# Patient Record
Sex: Male | Born: 1995 | Race: Black or African American | Hispanic: No | Marital: Single | Smoking: Current some day smoker
Health system: Southern US, Community
[De-identification: ages and names within clinical notes are randomized; demographics above are authoritative.]

## PROBLEM LIST (undated history)

## (undated) HISTORY — PX: NECK SURGERY: SHX720

---

## 2017-08-22 ENCOUNTER — Emergency Department (HOSPITAL_BASED_OUTPATIENT_CLINIC_OR_DEPARTMENT_OTHER)
Admission: EM | Admit: 2017-08-22 | Discharge: 2017-08-22 | Disposition: A | Discharge status: Home / Self Care | Payer: Self-pay | Attending: Emergency Medicine | Admitting: Emergency Medicine

## 2017-08-22 ENCOUNTER — Emergency Department (HOSPITAL_BASED_OUTPATIENT_CLINIC_OR_DEPARTMENT_OTHER): Payer: Self-pay

## 2017-08-22 ENCOUNTER — Encounter (HOSPITAL_BASED_OUTPATIENT_CLINIC_OR_DEPARTMENT_OTHER): Payer: Self-pay | Admitting: Emergency Medicine

## 2017-08-22 ENCOUNTER — Other Ambulatory Visit: Payer: Self-pay

## 2017-08-22 DIAGNOSIS — Y92410 Unspecified street and highway as the place of occurrence of the external cause: Secondary | ICD-10-CM | POA: Insufficient documentation

## 2017-08-22 DIAGNOSIS — F17200 Nicotine dependence, unspecified, uncomplicated: Secondary | ICD-10-CM | POA: Insufficient documentation

## 2017-08-22 DIAGNOSIS — Y9389 Activity, other specified: Secondary | ICD-10-CM | POA: Insufficient documentation

## 2017-08-22 DIAGNOSIS — Y999 Unspecified external cause status: Secondary | ICD-10-CM | POA: Insufficient documentation

## 2017-08-22 DIAGNOSIS — R0789 Other chest pain: Secondary | ICD-10-CM

## 2017-08-22 DIAGNOSIS — S060X0A Concussion without loss of consciousness, initial encounter: Secondary | ICD-10-CM | POA: Insufficient documentation

## 2017-08-22 MED ORDER — IBUPROFEN 400 MG PO TABS
600.0000 mg | ORAL_TABLET | Freq: Once | ORAL | Status: AC
  Administered 2017-08-22: 600 mg via ORAL
  Filled 2017-08-22: qty 1
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
## 2017-08-22 NOTE — Discharge Instructions (Addendum)
If your headache worsens or you develop vomiting, blurry vision, dizziness, or weakness/numbness in your arms or legs, or confusion then return to the ER for evaluation.  Otherwise take ibuprofen and/or Tylenol for pain.

## 2017-08-23 ENCOUNTER — Emergency Department (HOSPITAL_BASED_OUTPATIENT_CLINIC_OR_DEPARTMENT_OTHER)
Admission: EM | Admit: 2017-08-23 | Discharge: 2017-08-24 | Disposition: A | Discharge status: Home / Self Care | Payer: Self-pay | Attending: Emergency Medicine | Admitting: Emergency Medicine

## 2017-08-23 ENCOUNTER — Encounter (HOSPITAL_BASED_OUTPATIENT_CLINIC_OR_DEPARTMENT_OTHER): Payer: Self-pay

## 2017-08-23 ENCOUNTER — Other Ambulatory Visit: Payer: Self-pay

## 2017-08-23 DIAGNOSIS — G44319 Acute post-traumatic headache, not intractable: Secondary | ICD-10-CM | POA: Insufficient documentation

## 2017-08-23 DIAGNOSIS — F17200 Nicotine dependence, unspecified, uncomplicated: Secondary | ICD-10-CM | POA: Insufficient documentation

## 2017-08-23 NOTE — ED Triage Notes (Signed)
Pt c/o cont'd pain to right side of head and back of head-was seen here yesterday dx with concussion-NAD-steady gait

## 2017-08-24 MED ORDER — PROCHLORPERAZINE EDISYLATE 10 MG/2ML IJ SOLN
10.0000 mg | Freq: Once | INTRAMUSCULAR | Status: AC
  Administered 2017-08-24: 10 mg via INTRAMUSCULAR
  Filled 2017-08-24: qty 2

## 2017-08-24 MED ORDER — IBUPROFEN 600 MG PO TABS: 600.0000 mg | ORAL_TABLET | Freq: Three times a day (TID) | ORAL | 0 refills | Status: DC | PRN

## 2017-08-24 MED ORDER — KETOROLAC TROMETHAMINE 60 MG/2ML IM SOLN
60.0000 mg | Freq: Once | INTRAMUSCULAR | Status: AC
  Administered 2017-08-24: 60 mg via INTRAMUSCULAR
  Filled 2017-08-24: qty 2

## 2017-08-24 NOTE — ED Notes (Signed)
ED Provider at bedside. 
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
## 2017-11-17 ENCOUNTER — Other Ambulatory Visit: Payer: Self-pay

## 2017-11-17 ENCOUNTER — Encounter (HOSPITAL_BASED_OUTPATIENT_CLINIC_OR_DEPARTMENT_OTHER): Payer: Self-pay

## 2017-11-17 DIAGNOSIS — K029 Dental caries, unspecified: Secondary | ICD-10-CM | POA: Insufficient documentation

## 2017-11-17 DIAGNOSIS — K0381 Cracked tooth: Secondary | ICD-10-CM | POA: Insufficient documentation

## 2017-11-17 DIAGNOSIS — F17200 Nicotine dependence, unspecified, uncomplicated: Secondary | ICD-10-CM | POA: Insufficient documentation

## 2017-11-17 NOTE — ED Triage Notes (Signed)
C/o left upper toothache and HA x 2 hours-NAD-steady gait

## 2017-11-18 ENCOUNTER — Emergency Department (HOSPITAL_BASED_OUTPATIENT_CLINIC_OR_DEPARTMENT_OTHER)
Admission: EM | Admit: 2017-11-18 | Discharge: 2017-11-18 | Disposition: A | Discharge status: Home / Self Care | Payer: Self-pay | Attending: Emergency Medicine | Admitting: Emergency Medicine

## 2017-11-18 DIAGNOSIS — K029 Dental caries, unspecified: Secondary | ICD-10-CM

## 2017-11-18 MED ORDER — PENICILLIN V POTASSIUM 500 MG PO TABS: 500.0000 mg | ORAL_TABLET | Freq: Four times a day (QID) | ORAL | 0 refills | Status: AC

## 2017-11-18 MED ORDER — PENICILLIN V POTASSIUM 250 MG PO TABS
500.0000 mg | ORAL_TABLET | Freq: Once | ORAL | Status: AC
  Administered 2017-11-18: 500 mg via ORAL
  Filled 2017-11-18: qty 2

## 2017-11-18 MED ORDER — HYDROCODONE-ACETAMINOPHEN 5-325 MG PO TABS: 1.0000 | ORAL_TABLET | ORAL | 0 refills | Status: DC | PRN

## 2017-11-18 MED ORDER — HYDROCODONE-ACETAMINOPHEN 5-325 MG PO TABS
1.0000 | ORAL_TABLET | Freq: Once | ORAL | Status: AC
  Administered 2017-11-18: 1 via ORAL
  Filled 2017-11-18: qty 1

## 2017-11-18 NOTE — ED Notes (Signed)
Pt brought back to room, had to be woken up in the waiting room. Pt holding a towel to his mouth, c/o dental pain

## 2017-11-18 NOTE — ED Notes (Signed)
Pt verbalizes understanding of d/c instructions and denies any further needs at this time. 

## 2017-11-18 NOTE — ED Provider Notes (Signed)
   [facility] Provider Note: Marco DellJ. Lane Brittish Bolinger, MD, FACEP  CSN: [id number] MRN: [id number] ARRIVAL: 11/17/17 at 2215 ROOM: [facility]   CHIEF COMPLAINT  Dental Pain   HISTORY OF PRESENT ILLNESS  11/18/17 1:02 AM Marco Atkinson is a 22 y.o. male who complains of 4 hours of severe pain associated with his left upper third molar.  That molar has been cracked for some time but just became painful yesterday evening.  The pain radiates to the entire left side of his face.  Pain is worse with eating or drinking.  He does not have a dentist locally.   History reviewed. No pertinent past medical history.  Past Surgical History:  Procedure Laterality Date  . NECK SURGERY      No family history on file.  Social History   Tobacco Use  . Smoking status: Current Every Day Smoker  . Smokeless tobacco: Never Used  Substance Use Topics  . Alcohol use: Yes    Comment: occ  . Drug use: Never    Prior to Admission medications   Medication Sig Start Date End Date Taking? Authorizing Provider  ibuprofen (ADVIL,MOTRIN) 600 MG tablet Take 1 tablet (600 mg total) by mouth every 8 (eight) hours as needed. 08/24/17   Marco Atkinson, Kevin, MD    Allergies Patient has no known allergies.   REVIEW OF SYSTEMS  Negative except as noted here or in the History of Present Illness.   PHYSICAL EXAMINATION  Initial Vital Signs Blood pressure (!) 141/94, pulse 80, temperature 98.9 F (37.2 C), temperature source Oral, resp. rate 18, height 5\' 6"  (1.676 m), weight 58.5 kg, SpO2 94 %.  Examination General: Well-developed, well-nourished male in no acute distress; appearance consistent with age of record HENT: normocephalic; atraumatic; fractured, carious left upper third molar with adjacent gum swelling and tenderness Eyes: pupils equal, round and reactive to light; extraocular muscles intact Neck: supple; no lymphadenopathy Heart: regular rate and rhythm Lungs: clear to auscultation  bilaterally Abdomen: soft; nondistended; nontender; bowel sounds present Extremities: No deformity; full range of motion Neurologic: Awake, alert and oriented; motor function intact in all extremities and symmetric; no facial droop Skin: Warm and dry Psychiatric: Flat affect   RESULTS  Summary of this visit's results, reviewed by myself:   EKG Interpretation  Date/Time:    Ventricular Rate:    PR Interval:    QRS Duration:   QT Interval:    QTC Calculation:   R Axis:     Text Interpretation:        Laboratory Studies: No results found for this or any previous visit (from the past 24 hour(s)). Imaging Studies: No results found.  ED COURSE and MDM  Nursing notes and initial vitals signs, including pulse oximetry, reviewed.  Vitals:   11/17/17 2227 11/17/17 2229  BP:  (!) 141/94  Pulse:  80  Resp:  18  Temp:  98.9 F (37.2 C)  TempSrc:  Oral  SpO2:  94%  Weight: 58.5 kg   Height: 5\' 6"  (1.676 m)    We do not have a dentist or oral surgeon on-call.  The patient was advised that he needs to find a dentist for definitive treatment.  PROCEDURES    ED DIAGNOSES     ICD-10-CM   1. Pain due to dental caries K02.9        Ansh Fauble, Jonny RuizJohn, MD 11/18/17 [phone]

## 2018-10-19 ENCOUNTER — Encounter (HOSPITAL_BASED_OUTPATIENT_CLINIC_OR_DEPARTMENT_OTHER): Payer: Self-pay | Admitting: Adult Health

## 2018-10-19 ENCOUNTER — Other Ambulatory Visit: Payer: Self-pay

## 2018-10-19 ENCOUNTER — Emergency Department (HOSPITAL_BASED_OUTPATIENT_CLINIC_OR_DEPARTMENT_OTHER)
Admission: EM | Admit: 2018-10-19 | Discharge: 2018-10-20 | Disposition: A | Discharge status: Home / Self Care | Payer: Self-pay | Attending: Emergency Medicine | Admitting: Emergency Medicine

## 2018-10-19 DIAGNOSIS — F17210 Nicotine dependence, cigarettes, uncomplicated: Secondary | ICD-10-CM | POA: Insufficient documentation

## 2018-10-19 DIAGNOSIS — J029 Acute pharyngitis, unspecified: Secondary | ICD-10-CM | POA: Insufficient documentation

## 2018-10-19 NOTE — ED Triage Notes (Signed)
Presents with sore throat that began this am after working. He denies fevers.

## 2018-10-20 ENCOUNTER — Encounter (HOSPITAL_BASED_OUTPATIENT_CLINIC_OR_DEPARTMENT_OTHER): Payer: Self-pay | Admitting: Emergency Medicine

## 2018-10-20 LAB — GROUP A STREP BY PCR: Group A Strep by PCR: NOT DETECTED

## 2018-10-20 MED ORDER — ALUM & MAG HYDROXIDE-SIMETH 200-200-20 MG/5ML PO SUSP
ORAL | Status: AC
  Administered 2018-10-20: 30 mL
  Filled 2018-10-20: qty 30

## 2018-10-20 MED ORDER — LIDOCAINE VISCOUS HCL 2 % MT SOLN
OROMUCOSAL | Status: AC
  Administered 2018-10-20: 15 mL
  Filled 2018-10-20: qty 15

## 2018-10-20 MED ORDER — ACETAMINOPHEN 500 MG PO TABS
ORAL_TABLET | ORAL | Status: AC
  Administered 2018-10-20: 1000 mg
  Filled 2018-10-20: qty 2

## 2018-10-20 NOTE — ED Provider Notes (Signed)
[facility] EMERGENCY DEPARTMENT Provider Note   CSN: [id number] Arrival date & time: 10/19/18  2329     History   Chief Complaint Chief Complaint  Patient presents with  . Sore Throat    HPI Marco Atkinson is a 23 y.o. male.     The history is provided by the patient.  Sore Throat This is a new problem. The current episode started 6 to 12 hours ago. The problem occurs constantly. The problem has not changed since onset.Pertinent negatives include no chest pain, no abdominal pain, no headaches and no shortness of breath. Nothing aggravates the symptoms. Nothing relieves the symptoms. He has tried nothing for the symptoms. The treatment provided no relief.  Sore throat started at work.  No F/C/R.  No cough.  No sob.  No anosmia.  No covid exposure.  Intact voice no pain swallowing.    History reviewed. No pertinent past medical history.  There are no active problems to display for this patient.   Past Surgical History:  Procedure Laterality Date  . NECK SURGERY          Home Medications    Prior to Admission medications   Medication Sig Start Date End Date Taking? Authorizing Provider  HYDROcodone-acetaminophen (NORCO) 5-325 MG tablet Take 1 tablet by mouth every 4 (four) hours as needed for severe pain. 11/18/17   Molpus, John, MD  ibuprofen (ADVIL,MOTRIN) 600 MG tablet Take 1 tablet (600 mg total) by mouth every 8 (eight) hours as needed. 08/24/17   Azalia Bilisampos, Kevin, MD    Family History History reviewed. No pertinent family history.  Social History Social History   Tobacco Use  . Smoking status: Current Every Day Smoker  . Smokeless tobacco: Never Used  Substance Use Topics  . Alcohol use: Yes    Comment: occ  . Drug use: Never     Allergies   Patient has no known allergies.   Review of Systems Review of Systems  Constitutional: Negative for diaphoresis and fever.  HENT: Positive for sore throat. Negative for congestion, drooling and  trouble swallowing.   Eyes: Negative for visual disturbance.  Respiratory: Negative for cough and shortness of breath.   Cardiovascular: Negative for chest pain.  Gastrointestinal: Negative for abdominal pain.  Genitourinary: Negative for difficulty urinating.  Neurological: Negative for headaches.  Psychiatric/Behavioral: Negative for agitation.  All other systems reviewed and are negative.    Physical Exam Updated Vital Signs BP 124/79   Pulse 80   Temp 98.8 F (37.1 C) (Oral)   Resp 18   Ht 5\' 6"  (1.676 m)   Wt 64.4 kg   SpO2 100%   BMI 22.92 kg/m   Physical Exam Vitals signs and nursing note reviewed.  Constitutional:      Appearance: He is normal weight.  HENT:     Head: Normocephalic and atraumatic.     Nose: Nose normal.     Mouth/Throat:     Mouth: Mucous membranes are moist.     Pharynx: Oropharynx is clear. No oropharyngeal exudate.     Comments: Intact phonation no pain with displacement of the trachea Eyes:     Conjunctiva/sclera: Conjunctivae normal.     Pupils: Pupils are equal, round, and reactive to light.  Neck:     Musculoskeletal: Normal range of motion and neck supple.  Cardiovascular:     Rate and Rhythm: Normal rate and regular rhythm.     Pulses: Normal pulses.     Heart sounds: Normal  heart sounds.  Pulmonary:     Effort: Pulmonary effort is normal.     Breath sounds: Normal breath sounds.  Abdominal:     General: Abdomen is flat. Bowel sounds are normal.     Tenderness: There is no abdominal tenderness. There is no guarding.  Musculoskeletal: Normal range of motion.  Lymphadenopathy:     Cervical: No cervical adenopathy.  Skin:    General: Skin is warm and dry.     Capillary Refill: Capillary refill takes less than 2 seconds.  Neurological:     General: No focal deficit present.     Mental Status: He is alert and oriented to person, place, and time.  Psychiatric:        Mood and Affect: Mood normal.        Behavior: Behavior  normal.      ED Treatments / Results  Labs (all labs ordered are listed, but only abnormal results are displayed) Labs Reviewed  GROUP A STREP BY PCR    EKG None  Radiology No results found.  Procedures Procedures (including critical care time)  Medications Ordered in ED Medications - No data to display  Doubt COVID.  Likely allergic in nature.  Alternate tylenol and ibuprofen at home.  Strep is negative.    Final Clinical Impressions(s) / ED Diagnoses   Final diagnoses:  Sore throat   Return for intractable cough, coughing up blood,fevers >100.4 unrelieved by medication, shortness of breath, intractable vomiting, chest pain, shortness of breath, weakness,numbness, changes in speech, facial asymmetry,abdominal pain, passing out,Inability to tolerate liquids or food, cough, altered mental status or any concerns. No signs of systemic illness or infection. The patient is nontoxic-appearing on exam and vital signs are within normal limits.   I have reviewed the triage vital signs and the nursing notes. Pertinent labs &imaging results that were available during my care of the patient were reviewed by me and considered in my medical decision making (see chart for details).  After history, exam, and medical workup I feel the patient has been appropriately medically screened and is safe for discharge home. Pertinent diagnoses were discussed with the patient. Patient was given return precautions   Arrie Zuercher, MD 10/20/18 [id number]

## 2018-12-12 ENCOUNTER — Emergency Department (HOSPITAL_BASED_OUTPATIENT_CLINIC_OR_DEPARTMENT_OTHER)
Admission: EM | Admit: 2018-12-12 | Discharge: 2018-12-12 | Disposition: A | Discharge status: Home / Self Care | Payer: Self-pay | Attending: Emergency Medicine | Admitting: Emergency Medicine

## 2018-12-12 ENCOUNTER — Other Ambulatory Visit: Payer: Self-pay

## 2018-12-12 ENCOUNTER — Encounter (HOSPITAL_BASED_OUTPATIENT_CLINIC_OR_DEPARTMENT_OTHER): Payer: Self-pay

## 2018-12-12 DIAGNOSIS — F17210 Nicotine dependence, cigarettes, uncomplicated: Secondary | ICD-10-CM | POA: Insufficient documentation

## 2018-12-12 DIAGNOSIS — J302 Other seasonal allergic rhinitis: Secondary | ICD-10-CM | POA: Insufficient documentation

## 2018-12-12 DIAGNOSIS — F1210 Cannabis abuse, uncomplicated: Secondary | ICD-10-CM | POA: Insufficient documentation

## 2018-12-12 DIAGNOSIS — H1031 Unspecified acute conjunctivitis, right eye: Secondary | ICD-10-CM | POA: Insufficient documentation

## 2018-12-12 MED ORDER — ERYTHROMYCIN 5 MG/GM OP OINT: TOPICAL_OINTMENT | OPHTHALMIC | 0 refills | Status: DC

## 2018-12-12 MED ORDER — FLUTICASONE PROPIONATE 50 MCG/ACT NA SUSP: 1.0000 | Freq: Every day | NASAL | 0 refills | Status: DC

## 2018-12-12 NOTE — ED Triage Notes (Addendum)
Pt c/o redness and irritation "pink eye" to right eye x 2 days-NAD-steady gait

## 2018-12-12 NOTE — ED Provider Notes (Signed)
[facility] EMERGENCY DEPARTMENT Provider Note   CSN: [id number] Arrival date & time: 12/12/18  1842     History   Chief Complaint Chief Complaint  Patient presents with  . Conjunctivitis    HPI Marco Atkinson is a 23 y.o. male    Patient complains of 2 days of right eye redness with white discharge and crusting in the morning.  Patient states that he has had no changes in vision but some mild pain intermittently.  Patient states he also has some congestion intermittently over the past week.  Denies any sick contacts.  Is concerned since pinkeye.  Has no history of pinkeye.  Not a contact lens wearer.  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  Patient also states that he has not scratched his eye, been hit in the eye or done any recent metal grinding.       HPI  History reviewed. No pertinent past medical history.  There are no active problems to display for this patient.   Past Surgical History:  Procedure Laterality Date  . NECK SURGERY          Home Medications    Prior to Admission medications   Medication Sig Start Date End Date Taking? Authorizing Provider  erythromycin ophthalmic ointment Place a 1/2 inch ribbon of ointment into the lower eyelid every 6 hours for the next 7 days 12/12/18   Solon Augusta S, PA  fluticasone (FLONASE) 50 MCG/ACT nasal spray Place 1 spray into both nostrils daily. 12/12/18   Gailen Shelter, PA  HYDROcodone-acetaminophen (NORCO) 5-325 MG tablet Take 1 tablet by mouth every 4 (four) hours as needed for severe pain. 11/18/17   Molpus, John, MD  ibuprofen (ADVIL,MOTRIN) 600 MG tablet Take 1 tablet (600 mg total) by mouth every 8 (eight) hours as needed. 08/24/17   Azalia Bilis, MD    Family History No family history on file.  Social History Social History   Tobacco Use  . Smoking status:  Current Every Day Smoker    Types: Cigarettes  . Smokeless tobacco: Never Used  Substance Use Topics  . Alcohol use: Yes    Comment: weekly  . Drug use: Yes    Types: Marijuana     Allergies   Patient has no known allergies.   Review of Systems Review of Systems  Constitutional: Negative for chills and fever.  HENT: Positive for rhinorrhea. Negative for sore throat.   Eyes: Negative for visual disturbance.  Respiratory: Negative for cough.      Physical Exam Updated Vital Signs BP 114/67 (BP Location: Left Arm)   Pulse 68   Temp 98.5 F (36.9 C) (Oral)   Resp 18   Ht 5\' 6"  (1.676 m)   Wt 59.4 kg   SpO2 96%   BMI 21.14 kg/m   Physical Exam Vitals signs and nursing note reviewed.  Constitutional:      General: He is not in acute distress.    Appearance: Normal appearance. He is not ill-appearing.  HENT:     Head: Normocephalic and atraumatic.     Nose:     Comments: No maxillary facial sinus tenderness. Eyes:     General: No scleral icterus.       Right eye: Discharge present.        Left eye: No discharge.     Extraocular Movements:  Extraocular movements intact.     Conjunctiva/sclera: Conjunctivae normal.     Comments: Patient has generalized scleral injection with small amount of white discharge in the medial canthus.  Neck:     Musculoskeletal: No neck rigidity or muscular tenderness.     Comments: No anterior, posterior cervical, pre-or postauricular lymphadenopathy Cardiovascular:     Rate and Rhythm: Normal rate.  Pulmonary:     Effort: Pulmonary effort is normal.     Breath sounds: No stridor.  Neurological:     Mental Status: He is alert and oriented to person, place, and time. Mental status is at baseline.      ED Treatments / Results  Labs (all labs ordered are listed, but only abnormal results are displayed) Labs Reviewed - No data to display  EKG None  Radiology No results found.  Procedures Procedures (including critical care  time)  Medications Ordered in ED Medications - No data to display   Initial Impression / Assessment and Plan / ED Course  I have reviewed the triage vital signs and the nursing notes.  Pertinent labs & imaging results that were available during my care of the patient were reviewed by me and considered in my medical decision making (see chart for details).        Patient appears to have pinkeye on my exam.  Patient's history is consistent with either bacterial or viral conjunctivitis.  I will treat with erythromycin as patient has purulent discharge in the morning.  Crusting.  However I do have suspicion for seasonal allergies and will prescribe fluticasone in direct patient to use over-the-counter antihistamine eyedrop once he is done using erythromycin eye.  Fluticasone prescribed for sinus congestion.  No evidence of sinus infection.  Patient symptoms are less than 10 days.  Vital signs are all within normal notes.   Final Clinical Impressions(s) / ED Diagnoses   Final diagnoses:  Seasonal allergic rhinitis, unspecified trigger  Acute bacterial conjunctivitis of right eye    ED Discharge Orders         Ordered    erythromycin ophthalmic ointment     12/12/18 2149    fluticasone (FLONASE) 50 MCG/ACT nasal spray  Daily     12/12/18 2149           Tedd Sias, [place] 12/12/18 2153    Quintella Reichert, MD 12/14/18 1250

## 2018-12-12 NOTE — Discharge Instructions (Addendum)
Use fluticasone daily as prescribed.  May need to use over-the-counter in the same fashion once you are done with the supply.  Apply erythromycin antibiotic ointment to right eye as prescription directs.  Once you have completed course of antibiotic ointment please begin using over-the-counter antihistamine eyedrops.

## 2018-12-12 NOTE — ED Triage Notes (Signed)
Pt to desk to ask how much longer he had to wait.  Pt reassured. Pt turned and walked out the front door. No notification if he was coming back or not.

## 2019-04-09 ENCOUNTER — Other Ambulatory Visit: Payer: Self-pay

## 2019-04-09 ENCOUNTER — Encounter (HOSPITAL_COMMUNITY): Payer: Self-pay

## 2019-04-09 ENCOUNTER — Ambulatory Visit (HOSPITAL_COMMUNITY)
Admission: EM | Admit: 2019-04-09 | Discharge: 2019-04-09 | Disposition: A | Discharge status: Home / Self Care | Payer: Self-pay | Attending: Emergency Medicine | Admitting: Emergency Medicine

## 2019-04-09 DIAGNOSIS — R0789 Other chest pain: Secondary | ICD-10-CM

## 2019-04-09 MED ORDER — NAPROXEN 500 MG PO TABS: 500.0000 mg | ORAL_TABLET | Freq: Two times a day (BID) | ORAL | 0 refills | Status: DC

## 2019-04-09 NOTE — ED Triage Notes (Signed)
Pt c/o mid-sternal CP since last night while drinking ETOH, and smoking marijuana. States CP increases with inspiration and with twisting upper body/torso toward right. Denies n/v, diaphoresis, dizziness.

## 2019-04-09 NOTE — Discharge Instructions (Signed)
Your EKG was normal, this chest pain seems most likely from inflammation within the muscles of the chest wall Begin Naprosyn twice daily for the next 1 to 2 weeks, please take with food  Please monitor for symptoms to resolve over the next week with taking the anti-inflammatories  If chest pain changing, worsening, developing difficulty breathing or increased shortness of breath please follow-up for reevaluation

## 2019-04-10 NOTE — ED Provider Notes (Signed)
[facility]    CSN: [id number] Arrival date & time: 04/09/19  1721      History   Chief Complaint Chief Complaint  Patient presents with  . Chest Pain    HPI Marco Atkinson is a 24 y.o. male no significant past medical history presenting today for evaluation of chest pain.  Patient states that he began to develop some discomfort in his chest last night.  He reports this began after drinking alcohol as well as smoking marijuana.  Notes that his pain worsens with breathing as well as movement.  He does report vomiting last night, but denies current nausea or vomiting.  Denies dizziness or lightheadedness.  Denies abdominal pain.  Denies history of any heart issues.  Denies hypertension.  Denies diabetes.  Denies family history of early death from MI.  Does report tobacco use, approximately a pack per day.  Denies prior DVT/PE.  Denies leg pain or leg swelling.  Denies recent travel immobilization.  Has not taken any medicines for symptoms.  Denies associated cough or recent illness.  HPI  History reviewed. No pertinent past medical history.  There are no problems to display for this patient.   Past Surgical History:  Procedure Laterality Date  . NECK SURGERY         Home Medications    Prior to Admission medications   Medication Sig Start Date End Date Taking? Authorizing Provider  naproxen (NAPROSYN) 500 MG tablet Take 1 tablet (500 mg total) by mouth 2 (two) times daily. 04/09/19   Davidson Palmieri C, PA-C  fluticasone (FLONASE) 50 MCG/ACT nasal spray Place 1 spray into both nostrils daily. 12/12/18 04/09/19  Gailen Shelter, PA    Family History Family History  Problem Relation Age of Onset  . Healthy Mother   . Healthy Father     Social History Social History   Tobacco Use  . Smoking status: Current Every Day Smoker    Types: Cigarettes  . Smokeless tobacco: Never Used  Substance Use Topics  . Alcohol use: Yes    Comment: weekly  . Drug use: Yes    Types: Marijuana     Allergies   Patient has no known allergies.   Review of Systems Review of Systems  Constitutional: Negative for activity change, appetite change, chills, fatigue and fever.  HENT: Negative for congestion, ear pain, rhinorrhea, sinus pressure, sore throat and trouble swallowing.   Eyes: Negative for discharge and redness.  Respiratory: Negative for cough, chest tightness and shortness of breath.   Cardiovascular: Positive for chest pain. Negative for leg swelling.  Gastrointestinal: Negative for abdominal pain, diarrhea, nausea and vomiting.  Musculoskeletal: Negative for myalgias.  Skin: Negative for rash.  Neurological: Negative for dizziness, light-headedness and headaches.     Physical Exam Triage Vital Signs ED Triage Vitals  Enc Vitals Group     BP 04/09/19 1748 (!) 149/83     Pulse Rate 04/09/19 1748 90     Resp 04/09/19 1748 18     Temp 04/09/19 1748 98.2 F (36.8 C)     Temp Source 04/09/19 1748 Oral     SpO2 04/09/19 1748 96 %     Weight --      Height --      Head Circumference --      Peak Flow --      Pain Score 04/09/19 1747 6     Pain Loc --      Pain Edu? --  Excl. in GC? --    No data found.  Updated Vital Signs BP (!) 149/83 (BP Location: Right Arm)   Pulse 90   Temp 98.2 F (36.8 C) (Oral)   Resp 18   SpO2 96%   Visual Acuity Right Eye Distance:   Left Eye Distance:   Bilateral Distance:    Right Eye Near:   Left Eye Near:    Bilateral Near:     Physical Exam Vitals and nursing note reviewed.  Constitutional:      Appearance: He is well-developed.  HENT:     Head: Normocephalic and atraumatic.     Ears:     Comments: Bilateral ears without tenderness to palpation of external auricle, tragus and mastoid, EAC's without erythema or swelling, TM's with good bony landmarks and cone of light. Non erythematous.     Mouth/Throat:     Comments: Oral mucosa pink and moist, no tonsillar enlargement or exudate.  Posterior pharynx patent and nonerythematous, no uvula deviation or swelling. Normal phonation.  Eyes:     Conjunctiva/sclera: Conjunctivae normal.  Cardiovascular:     Rate and Rhythm: Normal rate and regular rhythm.     Heart sounds: No murmur.  Pulmonary:     Effort: Pulmonary effort is normal. No respiratory distress.     Breath sounds: Normal breath sounds.     Comments: Breathing comfortably at rest, CTABL, no wheezing, rales or other adventitious sounds auscultated  Anterior chest diffusely tender especially on right and central areas Chest:     Chest wall: Tenderness present.  Abdominal:     Palpations: Abdomen is soft.     Tenderness: There is no abdominal tenderness.  Musculoskeletal:     Cervical back: Neck supple.  Skin:    General: Skin is warm and dry.  Neurological:     Mental Status: He is alert.      UC Treatments / Results  Labs (all labs ordered are listed, but only abnormal results are displayed) Labs Reviewed - No data to display  EKG   Radiology No results found.  Procedures Procedures (including critical care time)  Medications Ordered in UC Medications - No data to display  Initial Impression / Assessment and Plan / UC Course  I have reviewed the triage vital signs and the nursing notes.  Pertinent labs & imaging results that were available during my care of the patient were reviewed by me and considered in my medical decision making (see chart for details).     EKG normal sinus rhythm with sinus arrhythmia.  No acute signs of ischemia or infarction.  Patient is low risk for cardiac etiology.  PERC negative.  Given reproducible on exam, most likely MSK cause, inflammation within muscles, possibly related to either smoking or vomiting last night.  Providing Naprosyn to take twice daily and continue to monitor discomfort over the next few days.  Discussed strict return precautions. Patient verbalized understanding and is agreeable with  plan.  Final Clinical Impressions(s) / UC Diagnoses   Final diagnoses:  Chest wall pain     Discharge Instructions     Your EKG was normal, this chest pain seems most likely from inflammation within the muscles of the chest wall Begin Naprosyn twice daily for the next 1 to 2 weeks, please take with food  Please monitor for symptoms to resolve over the next week with taking the anti-inflammatories  If chest pain changing, worsening, developing difficulty breathing or increased shortness of breath please follow-up for  reevaluation   ED Prescriptions    Medication Sig Dispense Auth. Provider   naproxen (NAPROSYN) 500 MG tablet Take 1 tablet (500 mg total) by mouth 2 (two) times daily. 30 tablet Gurtej Noyola, [place] C, PA-C     PDMP not reviewed this encounter.   Lew Dawes, [place] 04/10/19 [phone]

## 2019-06-10 ENCOUNTER — Encounter (HOSPITAL_BASED_OUTPATIENT_CLINIC_OR_DEPARTMENT_OTHER): Payer: Self-pay | Admitting: Emergency Medicine

## 2019-06-10 ENCOUNTER — Emergency Department (HOSPITAL_BASED_OUTPATIENT_CLINIC_OR_DEPARTMENT_OTHER)
Admission: EM | Admit: 2019-06-10 | Discharge: 2019-06-10 | Disposition: A | Discharge status: Home / Self Care | Payer: Self-pay | Attending: Emergency Medicine | Admitting: Emergency Medicine

## 2019-06-10 ENCOUNTER — Emergency Department (HOSPITAL_BASED_OUTPATIENT_CLINIC_OR_DEPARTMENT_OTHER): Payer: Self-pay

## 2019-06-10 ENCOUNTER — Other Ambulatory Visit: Payer: Self-pay

## 2019-06-10 DIAGNOSIS — M546 Pain in thoracic spine: Secondary | ICD-10-CM | POA: Insufficient documentation

## 2019-06-10 DIAGNOSIS — G4489 Other headache syndrome: Secondary | ICD-10-CM | POA: Insufficient documentation

## 2019-06-10 DIAGNOSIS — F17210 Nicotine dependence, cigarettes, uncomplicated: Secondary | ICD-10-CM | POA: Insufficient documentation

## 2019-06-10 DIAGNOSIS — Z79899 Other long term (current) drug therapy: Secondary | ICD-10-CM | POA: Insufficient documentation

## 2019-06-10 MED ORDER — HYDROCODONE-ACETAMINOPHEN 5-325 MG PO TABS
1.0000 | ORAL_TABLET | Freq: Once | ORAL | Status: AC
  Administered 2019-06-10: 07:00:00 1 via ORAL
  Filled 2019-06-10: qty 1

## 2019-06-10 NOTE — Discharge Instructions (Signed)

## 2019-06-10 NOTE — ED Triage Notes (Signed)
Patient presents with complaints of mid back pain x 2 days and also co right side of head pain; states pain when pressure applied to right eye onset this evening. Denies any visual disturbance. Denies NV.

## 2019-06-10 NOTE — ED Provider Notes (Signed)
[facility] EMERGENCY DEPARTMENT Provider Note   CSN: [id number] Arrival date & time: 06/10/19  0159     History Chief Complaint  Patient presents with  . Headache    Marco Atkinson is a 24 y.o. male.  The history is provided by the patient.  Headache Pain location:  R parietal Quality:  Sharp Onset quality:  Gradual Timing:  Intermittent Progression:  Worsening Chronicity:  New Relieved by:  Nothing Exacerbated by: eye  movement and palpation. Associated symptoms: back pain   Associated symptoms: no blurred vision, no dizziness, no ear pain, no eye pain, no fever, no focal weakness, no hearing loss, no loss of balance, no numbness, no visual change, no vomiting and no weakness   Patient presents with 2 complaints.  He reports he has had headache for the past several days.  He reports it is mostly right-sided, and moves into the back of his head.  He reports with eye movement and palpation around the right eye it will radiate to the back of his head.  No trauma.  No fevers or vomiting.  He has no actual eye pain or visual disturbances.  He also reports bilateral upper back pain, but no trauma.  He reports he does bend over a lot at work and thinks he strained.  No anterior chest pain or shortness of breath.  No cough.  No fevers.  No focal weakness      PMH-previous h/o migraines Past Surgical History:  Procedure Laterality Date  . NECK SURGERY         Family History  Problem Relation Age of Onset  . Healthy Mother   . Healthy Father     Social History   Tobacco Use  . Smoking status: Current Every Day Smoker    Types: Cigarettes  . Smokeless tobacco: Never Used  Substance Use Topics  . Alcohol use: Yes    Comment: weekly  . Drug use: Yes    Types: Marijuana    Home Medications Prior to Admission medications   Medication Sig Start Date End Date Taking? Authorizing Provider  naproxen (NAPROSYN) 500 MG tablet Take 1 tablet (500 mg total) by  mouth 2 (two) times daily. 04/09/19   Wieters, Hallie C, PA-C  fluticasone (FLONASE) 50 MCG/ACT nasal spray Place 1 spray into both nostrils daily. 12/12/18 04/09/19  Gailen Shelter, PA    Allergies    Patient has no known allergies.  Review of Systems   Review of Systems  Constitutional: Negative for fever.  HENT: Negative for ear pain and hearing loss.   Eyes: Negative for blurred vision, pain, redness and visual disturbance.  Respiratory: Negative for shortness of breath.   Cardiovascular: Negative for chest pain.  Gastrointestinal: Negative for vomiting.  Musculoskeletal: Positive for back pain.  Neurological: Positive for headaches. Negative for dizziness, focal weakness, weakness, light-headedness, numbness and loss of balance.  All other systems reviewed and are negative.   Physical Exam Updated Vital Signs BP 107/67 (BP Location: Right Arm)   Pulse (!) 53   Temp 97.9 F (36.6 C) (Oral)   Resp 14   Ht 1.676 m (5\' 6" )   Wt 59 kg   SpO2 100%   BMI 20.99 kg/m   Physical Exam  CONSTITUTIONAL: Well developed/well nourished HEAD: Normocephalic/atraumatic, no rash, no erythema, no abscess, no signs of trauma, no crepitus EYES: EOMI/PERRL, no nystagmus, no ptosis, no erythema, no discharge, no consensual pain, no corneal hazing ENMT: Mucous membranes moist NECK: supple  no meningeal signs, no bruits SPINE/BACK:entire spine nontender CV: S1/S2 noted, no murmurs/rubs/gallops noted LUNGS: Lungs are clear to auscultation bilaterally, no apparent distress ABDOMEN: soft, nontender, no rebound or guarding GU:no cva tenderness NEURO:Awake/alert, face symmetric, no arm or leg drift is noted Equal 5/5 strength with shoulder abduction, elbow flex/extension, wrist flex/extension in upper extremities and equal hand grips bilaterally Equal 5/5 strength with hip flexion,knee flex/extension, foot dorsi/plantar flexion Cranial nerves 3/4/5/6/09/07/08/11/12 tested and intact Gait normal  without ataxia No past pointing Sensation to light touch intact in all extremities EXTREMITIES: pulses normal, full ROM SKIN: warm, color normal PSYCH: no abnormalities of mood noted, alert and oriented to situation   ED Results / Procedures / Treatments   Labs (all labs ordered are listed, but only abnormal results are displayed) Labs Reviewed - No data to display  EKG None  Radiology CT Head Wo Contrast  Result Date: 06/10/2019 CLINICAL DATA:  Headache EXAM: CT HEAD WITHOUT CONTRAST TECHNIQUE: Contiguous axial images were obtained from the base of the skull through the vertex without intravenous contrast. COMPARISON:  None. FINDINGS: Brain: There is no acute intracranial hemorrhage, mass effect, or edema. Gray-white differentiation is preserved. There is no extra-axial fluid collection. Ventricles and sulci are within normal limits in size and configuration. Vascular: No hyperdense vessel or unexpected calcification. Skull: Calvarium is unremarkable. Sinuses/Orbits: No acute finding. Other: None. IMPRESSION: No acute intracranial hemorrhage, mass effect, or evidence of acute infarction. Electronically Signed   By: Macy Mis M.D.   On: 06/10/2019 07:25    Procedures Procedures (including critical care time)  Medications Ordered in ED Medications  HYDROcodone-acetaminophen (NORCO/VICODIN) 5-325 MG per tablet 1 tablet (1 tablet Oral Given 06/10/19 0630)    ED Course  I have reviewed the triage vital signs and the nursing notes.  Pertinent  imaging results that were available during my care of the patient were reviewed by me and considered in my medical decision making (see chart for details).        MDM Rules/Calculators/A&P                        6:48 AM Patient presents with headache and back pain.  For his back pain, is reproducible, he has no focal neuro deficits.  This is likely due to musculoskeletal strain.  Patient reports this headache is severe at times.   He is very concerned and is requesting CT imaging.  He reports previous history of migraines.  He has no neuro deficits.  CT scan has been ordered 7:29 AM CT scan negative.  Patient stable for discharge. We will refer to neurology  MDM Number of Diagnoses or Management Options Acute bilateral thoracic back pain: new, no workup Other headache syndrome: new, needed workup   Amount and/or Complexity of Data Reviewed Tests in the radiology section of CPT: ordered Tests in the medicine section of CPT: ordered Independent visualization of images, tracings, or specimens: yes  Risk of Complications, Morbidity, and/or Mortality Presenting problems: moderate Diagnostic procedures: moderate Management options: moderate  Patient Progress Patient progress: stable  Final Clinical Impression(s) / ED Diagnoses Final diagnoses:  Other headache syndrome  Acute bilateral thoracic back pain    Rx / DC Orders ED Discharge Orders    None       Ripley Fraise, MD 06/10/19 [phone]

## 2019-08-22 ENCOUNTER — Emergency Department (HOSPITAL_BASED_OUTPATIENT_CLINIC_OR_DEPARTMENT_OTHER)
Admission: EM | Admit: 2019-08-22 | Discharge: 2019-08-22 | Disposition: A | Discharge status: Home / Self Care | Payer: Self-pay | Attending: Emergency Medicine | Admitting: Emergency Medicine

## 2019-08-22 ENCOUNTER — Encounter (HOSPITAL_BASED_OUTPATIENT_CLINIC_OR_DEPARTMENT_OTHER): Payer: Self-pay | Admitting: *Deleted

## 2019-08-22 ENCOUNTER — Other Ambulatory Visit: Payer: Self-pay

## 2019-08-22 DIAGNOSIS — Z792 Long term (current) use of antibiotics: Secondary | ICD-10-CM | POA: Insufficient documentation

## 2019-08-22 DIAGNOSIS — F17210 Nicotine dependence, cigarettes, uncomplicated: Secondary | ICD-10-CM | POA: Insufficient documentation

## 2019-08-22 DIAGNOSIS — Z202 Contact with and (suspected) exposure to infections with a predominantly sexual mode of transmission: Secondary | ICD-10-CM | POA: Insufficient documentation

## 2019-08-22 LAB — URINALYSIS, ROUTINE W REFLEX MICROSCOPIC
Bilirubin Urine: NEGATIVE
Glucose, UA: NEGATIVE mg/dL
Hgb urine dipstick: NEGATIVE
Ketones, ur: NEGATIVE mg/dL
Nitrite: NEGATIVE
Protein, ur: NEGATIVE mg/dL
Specific Gravity, Urine: >1.03 — ABNORMAL HIGH (ref 1.005–1.030)
pH: 6 (ref 5.0–8.0)

## 2019-08-22 LAB — URINALYSIS, MICROSCOPIC (REFLEX)

## 2019-08-22 LAB — HIV ANTIBODY (ROUTINE TESTING W REFLEX): HIV Screen 4th Generation wRfx: NONREACTIVE

## 2019-08-22 MED ORDER — DOXYCYCLINE HYCLATE 100 MG PO CAPS: 100.0000 mg | ORAL_CAPSULE | Freq: Two times a day (BID) | ORAL | 0 refills | Status: AC

## 2019-08-22 MED ORDER — CEFTRIAXONE SODIUM 500 MG IJ SOLR
500.0000 mg | Freq: Once | INTRAMUSCULAR | Status: AC
  Administered 2019-08-22: 500 mg via INTRAMUSCULAR
  Filled 2019-08-22: qty 500

## 2019-08-22 MED ORDER — LIDOCAINE HCL (PF) 1 % IJ SOLN
INTRAMUSCULAR | Status: AC
  Administered 2019-08-22: 5 mL
  Filled 2019-08-22: qty 5

## 2019-08-22 NOTE — ED Triage Notes (Signed)
exposure STD , denies discharge

## 2019-08-22 NOTE — ED Provider Notes (Addendum)
[facility] EMERGENCY DEPARTMENT Provider Note   CSN: [id number] Arrival date & time: 08/22/19  1315     History Chief Complaint  Patient presents with  . Exposure to STD    Marco Atkinson is a 24 y.o. male.  HPI   Pt is a 24 y/o male presenting for STD eval. States his partner tested positive for chlamydia. He is asymptomatic.   History reviewed. No pertinent past medical history.  There are no problems to display for this patient.   Past Surgical History:  Procedure Laterality Date  . NECK SURGERY         Family History  Problem Relation Age of Onset  . Healthy Mother   . Healthy Father     Social History   Tobacco Use  . Smoking status: Current Every Day Smoker    Types: Cigarettes  . Smokeless tobacco: Never Used  Vaping Use  . Vaping Use: Never used  Substance Use Topics  . Alcohol use: Yes    Comment: weekly  . Drug use: Yes    Types: Marijuana    Home Medications Prior to Admission medications   Medication Sig Start Date End Date Taking? Authorizing Provider  doxycycline (VIBRAMYCIN) 100 MG capsule Take 1 capsule (100 mg total) by mouth 2 (two) times daily for 7 days. 08/22/19 08/29/19  Madlyn Crosby S, PA-C  naproxen (NAPROSYN) 500 MG tablet Take 1 tablet (500 mg total) by mouth 2 (two) times daily. 04/09/19   Wieters, Hallie C, PA-C  fluticasone (FLONASE) 50 MCG/ACT nasal spray Place 1 spray into both nostrils daily. 12/12/18 04/09/19  Gailen Shelter, PA    Allergies    Patient has no known allergies.  Review of Systems   Review of Systems  Gastrointestinal: Negative for abdominal pain, constipation, diarrhea, nausea and vomiting.  Genitourinary: Negative for discharge, dysuria, frequency, genital sores, hematuria, penile pain, penile swelling, scrotal swelling, testicular pain and urgency.    Physical Exam Updated Vital Signs BP 108/67   Pulse 64   Temp 98.5 F (36.9 C) (Oral)   Resp 18   SpO2 100%   Physical  Exam Constitutional:      General: He is not in acute distress.    Appearance: He is well-developed.  Eyes:     Conjunctiva/sclera: Conjunctivae normal.  Cardiovascular:     Rate and Rhythm: Normal rate.  Pulmonary:     Effort: Pulmonary effort is normal.  Abdominal:     General: Abdomen is flat.  Genitourinary:    Comments: deferred Skin:    General: Skin is warm and dry.  Neurological:     Mental Status: He is alert and oriented to person, place, and time.     ED Results / Procedures / Treatments   Labs (all labs ordered are listed, but only abnormal results are displayed) Labs Reviewed  URINALYSIS, ROUTINE W REFLEX MICROSCOPIC - Abnormal; Notable for the following components:      Result Value   APPearance HAZY (*)    Specific Gravity, Urine >1.030 (*)    Leukocytes,Ua SMALL (*)    All other components within normal limits  URINALYSIS, MICROSCOPIC (REFLEX) - Abnormal; Notable for the following components:   Bacteria, UA FEW (*)    All other components within normal limits  HIV ANTIBODY (ROUTINE TESTING W REFLEX)  RPR  GC/CHLAMYDIA PROBE AMP ([place]) NOT AT [facility]    EKG None  Radiology No results found.  Procedures Procedures (including critical care time)  Medications Ordered in ED Medications  cefTRIAXone (ROCEPHIN) injection 500 mg (has no administration in time range)    ED Course  I have reviewed the triage vital signs and the nursing notes.  Pertinent labs & imaging results that were available during my care of the patient were reviewed by me and considered in my medical decision making (see chart for details).    MDM Rules/Calculators/A&P                          Patient is afebrile without abdominal tenderness, abdominal pain or painful bowel movements to indicate prostatitis.  No tenderness to palpation of the testes or epididymis to suggest orchitis or epididymitis. UA not suggestive of UTI. Pt asymptomatic.  STD cultures obtained  including HIV, syphilis, gonorrhea and chlamydia. Patient to be discharged with instructions to follow up with PCP. Discussed importance of using protection when sexually active. Pt understands that they have GC/Chlamydia cultures pending and that they will need to inform all sexual partners if results return positive. Patient has been treated prophylactically with doxy and Rocephin.    Final Clinical Impression(s) / ED Diagnoses Final diagnoses:  STD exposure    Rx / DC Orders ED Discharge Orders         Ordered    doxycycline (VIBRAMYCIN) 100 MG capsule  2 times daily     Discontinue  Reprint     08/22/19 1516           Rodney Booze, PA-C 08/22/19 1516    Rodney Booze, PA-C 08/22/19 1517    Lucrezia Starch, MD 08/23/19 1006

## 2019-08-22 NOTE — Discharge Instructions (Signed)

## 2019-08-23 LAB — RPR: RPR Ser Ql: NONREACTIVE

## 2019-11-13 ENCOUNTER — Emergency Department (HOSPITAL_BASED_OUTPATIENT_CLINIC_OR_DEPARTMENT_OTHER)
Admission: EM | Admit: 2019-11-13 | Discharge: 2019-11-13 | Disposition: A | Discharge status: Home / Self Care | Payer: Self-pay | Attending: Emergency Medicine | Admitting: Emergency Medicine

## 2019-11-13 ENCOUNTER — Encounter (HOSPITAL_BASED_OUTPATIENT_CLINIC_OR_DEPARTMENT_OTHER): Payer: Self-pay | Admitting: *Deleted

## 2019-11-13 ENCOUNTER — Other Ambulatory Visit: Payer: Self-pay

## 2019-11-13 DIAGNOSIS — Z79899 Other long term (current) drug therapy: Secondary | ICD-10-CM | POA: Insufficient documentation

## 2019-11-13 DIAGNOSIS — K0889 Other specified disorders of teeth and supporting structures: Secondary | ICD-10-CM

## 2019-11-13 DIAGNOSIS — K029 Dental caries, unspecified: Secondary | ICD-10-CM | POA: Insufficient documentation

## 2019-11-13 DIAGNOSIS — F17210 Nicotine dependence, cigarettes, uncomplicated: Secondary | ICD-10-CM | POA: Insufficient documentation

## 2019-11-13 MED ORDER — PENICILLIN V POTASSIUM 500 MG PO TABS: 500.0000 mg | ORAL_TABLET | Freq: Four times a day (QID) | ORAL | 0 refills | Status: DC

## 2019-11-13 MED ORDER — NAPROXEN 500 MG PO TABS: 500.0000 mg | ORAL_TABLET | Freq: Two times a day (BID) | ORAL | 0 refills | Status: DC

## 2019-11-13 MED ORDER — KETOROLAC TROMETHAMINE 15 MG/ML IJ SOLN
15.0000 mg | Freq: Once | INTRAMUSCULAR | Status: AC
  Administered 2019-11-13: 15 mg via INTRAMUSCULAR
  Filled 2019-11-13: qty 1

## 2019-11-13 NOTE — ED Provider Notes (Signed)
[facility] EMERGENCY DEPARTMENT Provider Note   CSN: [id number] Arrival date & time: 11/13/19  0559     History Chief Complaint  Patient presents with  . Dental Pain    Marco Atkinson is a 24 y.o. male.  HPI     This is a 24 year old male who presents with dental pain.  Patient reports onset of symptoms last night.  Pain worsened this morning at 4 AM.  He took ibuprofen with minimal relief.  He reports pain in the right lower.  He does not have a dentist.  No recent dental work.  No fevers or difficulty swallowing.  He rates his pain at 7 out of 10.  History reviewed. No pertinent past medical history.  There are no problems to display for this patient.   Past Surgical History:  Procedure Laterality Date  . NECK SURGERY         Family History  Problem Relation Age of Onset  . Healthy Mother   . Healthy Father     Social History   Tobacco Use  . Smoking status: Current Every Day Smoker    Types: Cigarettes  . Smokeless tobacco: Never Used  Vaping Use  . Vaping Use: Never used  Substance Use Topics  . Alcohol use: Yes    Comment: weekly  . Drug use: Yes    Types: Marijuana    Home Medications Prior to Admission medications   Medication Sig Start Date End Date Taking? Authorizing Provider  naproxen (NAPROSYN) 500 MG tablet Take 1 tablet (500 mg total) by mouth 2 (two) times daily. 04/09/19   Wieters, Hallie C, PA-C  naproxen (NAPROSYN) 500 MG tablet Take 1 tablet (500 mg total) by mouth 2 (two) times daily. 11/13/19   Avett Reineck, Mayer Masker, MD  penicillin v potassium (VEETID) 500 MG tablet Take 1 tablet (500 mg total) by mouth 4 (four) times daily. 11/13/19   Karlye Ihrig, Mayer Masker, MD  fluticasone (FLONASE) 50 MCG/ACT nasal spray Place 1 spray into both nostrils daily. 12/12/18 04/09/19  Gailen Shelter, PA    Allergies    Patient has no known allergies.  Review of Systems   Review of Systems  Constitutional: Negative for fever.  HENT: Positive for  dental problem. Negative for trouble swallowing.   All other systems reviewed and are negative.   Physical Exam Updated Vital Signs BP 130/88   Pulse 66   Temp 97.7 F (36.5 C)   Resp 16   Ht 1.676 m (5\' 6" )   Wt 68.9 kg   SpO2 100%   BMI 24.53 kg/m   Physical Exam Vitals and nursing note reviewed.  Constitutional:      Appearance: He is well-developed. He is not ill-appearing.  HENT:     Head: Normocephalic and atraumatic.     Mouth/Throat:     Mouth: Mucous membranes are moist.     Comments: Tenderness palpation around the right lower jawline, no palpable abscess, dental caries noted, no fullness noted under the tongue, no trismus Eyes:     Pupils: Pupils are equal, round, and reactive to light.  Cardiovascular:     Rate and Rhythm: Normal rate and regular rhythm.  Pulmonary:     Effort: Pulmonary effort is normal. No respiratory distress.  Musculoskeletal:     Cervical back: Neck supple.  Lymphadenopathy:     Cervical: No cervical adenopathy.  Skin:    General: Skin is warm and dry.  Neurological:     Mental Status:  He is alert and oriented to person, place, and time.  Psychiatric:        Mood and Affect: Mood normal.     ED Results / Procedures / Treatments   Labs (all labs ordered are listed, but only abnormal results are displayed) Labs Reviewed - No data to display  EKG None  Radiology No results found.  Procedures Procedures (including critical care time)  Medications Ordered in ED Medications  ketorolac (TORADOL) 15 MG/ML injection 15 mg (15 mg Intramuscular Given 11/13/19 0938)    ED Course  I have reviewed the triage vital signs and the nursing notes.  Pertinent labs & imaging results that were available during my care of the patient were reviewed by me and considered in my medical decision making (see chart for details).    MDM Rules/Calculators/A&P                          Patient presents with dental pain.  Overall nontoxic and  vital signs reassuring.  No signs or symptoms of deep space infection or Ludwig's.  No drainable abscess.  Suspect underlying infection.  Patient does not have a dentist.  Will place on naproxen and penicillin.  Dental resources provided.  After history, exam, and medical workup I feel the patient has been appropriately medically screened and is safe for discharge home. Pertinent diagnoses were discussed with the patient. Patient was given return precautions.   Final Clinical Impression(s) / ED Diagnoses Final diagnoses:  Pain, dental    Rx / DC Orders ED Discharge Orders         Ordered    naproxen (NAPROSYN) 500 MG tablet  2 times daily        11/13/19 0645    penicillin v potassium (VEETID) 500 MG tablet  4 times daily        11/13/19 0645           Walfred Bettendorf, Mayer Masker, MD 11/13/19 [id number]

## 2019-11-13 NOTE — ED Notes (Signed)
Dr. Horton at bedside. 

## 2019-11-13 NOTE — ED Triage Notes (Signed)
Pt c/o right lower tooth pain that started last night. Took an aleve at 4am denies fevers. States he has had issues with this tooth. Does not have a Education officer, community.

## 2020-01-11 ENCOUNTER — Emergency Department (HOSPITAL_BASED_OUTPATIENT_CLINIC_OR_DEPARTMENT_OTHER)
Admission: EM | Admit: 2020-01-11 | Discharge: 2020-01-11 | Disposition: A | Discharge status: Home / Self Care | Payer: Self-pay | Attending: Emergency Medicine | Admitting: Emergency Medicine

## 2020-01-11 ENCOUNTER — Other Ambulatory Visit: Payer: Self-pay

## 2020-01-11 ENCOUNTER — Emergency Department (HOSPITAL_BASED_OUTPATIENT_CLINIC_OR_DEPARTMENT_OTHER): Payer: Self-pay

## 2020-01-11 ENCOUNTER — Encounter (HOSPITAL_BASED_OUTPATIENT_CLINIC_OR_DEPARTMENT_OTHER): Payer: Self-pay | Admitting: *Deleted

## 2020-01-11 DIAGNOSIS — F17210 Nicotine dependence, cigarettes, uncomplicated: Secondary | ICD-10-CM | POA: Insufficient documentation

## 2020-01-11 DIAGNOSIS — M25562 Pain in left knee: Secondary | ICD-10-CM | POA: Insufficient documentation

## 2020-01-11 DIAGNOSIS — R0781 Pleurodynia: Secondary | ICD-10-CM | POA: Insufficient documentation

## 2020-01-11 DIAGNOSIS — Y92410 Unspecified street and highway as the place of occurrence of the external cause: Secondary | ICD-10-CM | POA: Insufficient documentation
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
## 2020-01-11 NOTE — Discharge Instructions (Signed)
You have been seen here for right rib and left knee pain. I recommend taking over-the-counter pain medications like ibuprofen and/or Tylenol every 6 as needed.  Please follow dosage and on the back of bottle.  I also recommend applying heat to the area and stretching out the muscles as this will help decrease stiffness and pain.    Recommend follow-up with your PCP for further evaluation management if symptoms not resolved in 2 weeks.  Come back to the emergency department if you develop chest pain, shortness of breath, severe abdominal pain, uncontrolled nausea, vomiting, diarrhea.
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
## 2020-04-28 ENCOUNTER — Emergency Department (HOSPITAL_BASED_OUTPATIENT_CLINIC_OR_DEPARTMENT_OTHER)
Admission: EM | Admit: 2020-04-28 | Discharge: 2020-04-28 | Disposition: A | Discharge status: Home / Self Care | Payer: Self-pay | Attending: Emergency Medicine | Admitting: Emergency Medicine

## 2020-04-28 ENCOUNTER — Other Ambulatory Visit: Payer: Self-pay

## 2020-04-28 ENCOUNTER — Encounter (HOSPITAL_BASED_OUTPATIENT_CLINIC_OR_DEPARTMENT_OTHER): Payer: Self-pay

## 2020-04-28 ENCOUNTER — Emergency Department (HOSPITAL_BASED_OUTPATIENT_CLINIC_OR_DEPARTMENT_OTHER): Payer: Self-pay

## 2020-04-28 DIAGNOSIS — R1084 Generalized abdominal pain: Secondary | ICD-10-CM | POA: Insufficient documentation

## 2020-04-28 DIAGNOSIS — F17210 Nicotine dependence, cigarettes, uncomplicated: Secondary | ICD-10-CM | POA: Insufficient documentation

## 2020-04-28 LAB — CBC WITH DIFFERENTIAL/PLATELET
Abs Immature Granulocytes: 0.03 10*3/uL (ref 0.00–0.07)
Basophils Absolute: 0.1 10*3/uL (ref 0.0–0.1)
Basophils Relative: 1 %
Eosinophils Absolute: 0.3 10*3/uL (ref 0.0–0.5)
Eosinophils Relative: 3 %
HCT: 46.6 % (ref 39.0–52.0)
Hemoglobin: 16.1 g/dL (ref 13.0–17.0)
Immature Granulocytes: 0 %
Lymphocytes Relative: 13 %
Lymphs Abs: 1.4 10*3/uL (ref 0.7–4.0)
MCH: 32.7 pg (ref 26.0–34.0)
MCHC: 34.5 g/dL (ref 30.0–36.0)
MCV: 94.7 fL (ref 80.0–100.0)
Monocytes Absolute: 1.2 10*3/uL — ABNORMAL HIGH (ref 0.1–1.0)
Monocytes Relative: 12 %
Neutro Abs: 7.4 10*3/uL (ref 1.7–7.7)
Neutrophils Relative %: 71 %
Platelets: 368 10*3/uL (ref 150–400)
RBC: 4.92 MIL/uL (ref 4.22–5.81)
RDW: 12.1 % (ref 11.5–15.5)
WBC: 10.4 10*3/uL (ref 4.0–10.5)
nRBC: 0 % (ref 0.0–0.2)

## 2020-04-28 LAB — COMPREHENSIVE METABOLIC PANEL
ALT: 23 U/L (ref 0–44)
AST: 26 U/L (ref 15–41)
Albumin: 4.3 g/dL (ref 3.5–5.0)
Alkaline Phosphatase: 48 U/L (ref 38–126)
Anion gap: 10 (ref 5–15)
BUN: 11 mg/dL (ref 6–20)
CO2: 25 mmol/L (ref 22–32)
Calcium: 9.2 mg/dL (ref 8.9–10.3)
Chloride: 106 mmol/L (ref 98–111)
Creatinine, Ser: 1.07 mg/dL (ref 0.61–1.24)
GFR, Estimated: >60 mL/min (ref >60)
Glucose, Bld: 103 mg/dL — ABNORMAL HIGH (ref 70–99)
Potassium: 3.9 mmol/L (ref 3.5–5.1)
Sodium: 141 mmol/L (ref 135–145)
Total Bilirubin: 0.7 mg/dL (ref 0.3–1.2)
Total Protein: 7.4 g/dL (ref 6.5–8.1)

## 2020-04-28 LAB — URINALYSIS, ROUTINE W REFLEX MICROSCOPIC
Bilirubin Urine: NEGATIVE
Glucose, UA: NEGATIVE mg/dL
Hgb urine dipstick: NEGATIVE
Ketones, ur: NEGATIVE mg/dL
Leukocytes,Ua: NEGATIVE
Nitrite: NEGATIVE
Protein, ur: NEGATIVE mg/dL
Specific Gravity, Urine: 1.01 (ref 1.005–1.030)
pH: 6 (ref 5.0–8.0)

## 2020-04-28 LAB — LIPASE, BLOOD: Lipase: 31 U/L (ref 11–51)

## 2020-04-28 MED ORDER — ACETAMINOPHEN 500 MG PO TABS
1000.0000 mg | ORAL_TABLET | Freq: Once | ORAL | Status: AC
  Administered 2020-04-28: 1000 mg via ORAL
  Filled 2020-04-28: qty 2

## 2020-04-28 MED ORDER — IOHEXOL 300 MG/ML  SOLN
100.0000 mL | Freq: Once | INTRAMUSCULAR | Status: AC | PRN
  Administered 2020-04-28: 100 mL via INTRAVENOUS

## 2020-04-28 MED ORDER — ONDANSETRON HCL 4 MG/2ML IJ SOLN
4.0000 mg | Freq: Once | INTRAMUSCULAR | Status: AC
  Administered 2020-04-28: 4 mg via INTRAVENOUS
  Filled 2020-04-28: qty 2

## 2020-04-28 MED ORDER — MORPHINE SULFATE (PF) 2 MG/ML IV SOLN
2.0000 mg | Freq: Once | INTRAVENOUS | Status: AC
  Administered 2020-04-28: 2 mg via INTRAVENOUS
  Filled 2020-04-28: qty 1

## 2020-04-28 MED ORDER — DICYCLOMINE HCL 20 MG PO TABS: 20.0000 mg | ORAL_TABLET | Freq: Two times a day (BID) | ORAL | 0 refills | Status: AC | PRN

## 2020-04-28 NOTE — ED Provider Notes (Signed)
[facility] EMERGENCY DEPARTMENT Provider Note   CSN: [id number] Arrival date & time: 04/28/20  1441     History Chief Complaint  Patient presents with  . Abdominal Pain    Marco Atkinson is a 25 y.o. male without significant PMHx, presenting to the ED with complaint of abdominal pain that began early this morning around 4 AM.  He states he woke up with generalized aching pain in his abdomen.  Took some Tylenol and went back to sleep.  He woke back up and continued to have pain across his lower abdomen radiating to bilateral back.  Pain is his only symptom.  It is coming and going.  He denies associated nausea, vomiting, diarrhea or constipation.  He ate a large meal a various seafood items last night.  He endorses daily alcohol use, on average about 2 shots per day. Takes a Goody[employer] powder at least once daily.  The history is provided by the patient.       History reviewed. No pertinent past medical history.  There are no problems to display for this patient.   Past Surgical History:  Procedure Laterality Date  . NECK SURGERY         Family History  Problem Relation Age of Onset  . Healthy Mother   . Healthy Father     Social History   Tobacco Use  . Smoking status: Current Every Day Smoker    Types: Cigarettes  . Smokeless tobacco: Never Used  Vaping Use  . Vaping Use: Never used  Substance Use Topics  . Alcohol use: Yes    Comment: weekly  . Drug use: Yes    Types: Marijuana    Home Medications Prior to Admission medications   Medication Sig Start Date End Date Taking? Authorizing Provider  dicyclomine (BENTYL) 20 MG tablet Take 1 tablet (20 mg total) by mouth 2 (two) times daily as needed (abdominal cramping). 04/28/20  Yes Robinson, [place] N, PA-C  naproxen (NAPROSYN) 500 MG tablet Take 1 tablet (500 mg total) by mouth 2 (two) times daily. 04/09/19   Wieters, Hallie C, PA-C  naproxen (NAPROSYN) 500 MG tablet Take 1 tablet (500 mg total) by  mouth 2 (two) times daily. 11/13/19   Horton, Mayer Masker, MD  penicillin v potassium (VEETID) 500 MG tablet Take 1 tablet (500 mg total) by mouth 4 (four) times daily. 11/13/19   Horton, Mayer Masker, MD  fluticasone (FLONASE) 50 MCG/ACT nasal spray Place 1 spray into both nostrils daily. 12/12/18 04/09/19  Gailen Shelter, PA    Allergies    Patient has no known allergies.  Review of Systems   Review of Systems  Gastrointestinal: Positive for abdominal pain.  All other systems reviewed and are negative.   Physical Exam Updated Vital Signs BP (!) 139/93   Pulse 80   Temp 98.4 F (36.9 C) (Oral)   Resp 18   Ht 5\' 6"  (1.676 m)   Wt 69.9 kg   SpO2 100%   BMI 24.86 kg/m   Physical Exam Vitals and nursing note reviewed.  Constitutional:      Appearance: He is well-developed.  HENT:     Head: Normocephalic and atraumatic.  Eyes:     Conjunctiva/sclera: Conjunctivae normal.  Cardiovascular:     Rate and Rhythm: Normal rate and regular rhythm.  Pulmonary:     Effort: Pulmonary effort is normal. No respiratory distress.     Breath sounds: Normal breath sounds.  Abdominal:  General: Bowel sounds are normal.     Tenderness: There is abdominal tenderness in the right lower quadrant, periumbilical area, suprapubic area and left lower quadrant. There is no guarding or rebound.  Neurological:     Mental Status: He is alert.  Psychiatric:        Mood and Affect: Mood normal.        Behavior: Behavior normal.     ED Results / Procedures / Treatments   Labs (all labs ordered are listed, but only abnormal results are displayed) Labs Reviewed  COMPREHENSIVE METABOLIC PANEL - Abnormal; Notable for the following components:      Result Value   Glucose, Bld 103 (*)    All other components within normal limits  CBC WITH DIFFERENTIAL/PLATELET - Abnormal; Notable for the following components:   Monocytes Absolute 1.2 (*)    All other components within normal limits  URINALYSIS,  ROUTINE W REFLEX MICROSCOPIC - Abnormal; Notable for the following components:   Color, Urine STRAW (*)    All other components within normal limits  LIPASE, BLOOD    EKG None  Radiology CT Abdomen Pelvis W Contrast  Result Date: 04/28/2020 CLINICAL DATA:  Acute abdominal pain beginning this morning. EXAM: CT ABDOMEN AND PELVIS WITH CONTRAST TECHNIQUE: Multidetector CT imaging of the abdomen and pelvis was performed using the standard protocol following bolus administration of intravenous contrast. CONTRAST:  OMNIPAQUE IOHEXOL 300 MG/ML  SOLN COMPARISON:  None. FINDINGS: Lower Chest: No acute findings. Hepatobiliary: No hepatic masses identified. Gallbladder is unremarkable. No evidence of biliary ductal dilatation. Pancreas:  No mass or inflammatory changes. Spleen: Within normal limits in size and appearance. Adrenals/Urinary Tract: No masses identified. No evidence of ureteral calculi or hydronephrosis. Stomach/Bowel: No evidence of obstruction, inflammatory process or abnormal fluid collections. Normal appendix visualized. Vascular/Lymphatic: No pathologically enlarged lymph nodes. No abdominal aortic aneurysm. Reproductive:  No mass or other significant abnormality. Other:  None. Musculoskeletal:  No suspicious bone lesions identified. IMPRESSION: Negative. No acute findings or other significant abnormality. Electronically Signed   By: Danae Orleans M.D.   On: 04/28/2020 18:21    Procedures Procedures   Medications Ordered in ED Medications  acetaminophen (TYLENOL) tablet 1,000 mg (has no administration in time range)  ondansetron (ZOFRAN) injection 4 mg (4 mg Intravenous Given 04/28/20 1600)  morphine 2 MG/ML injection 2 mg (2 mg Intravenous Given 04/28/20 1600)  iohexol (OMNIPAQUE) 300 MG/ML solution 100 mL (100 mLs Intravenous Contrast Given 04/28/20 1807)    ED Course  I have reviewed the triage vital signs and the nursing notes.  Pertinent labs & imaging results that were  available during my care of the patient were reviewed by me and considered in my medical decision making (see chart for details).    MDM Rules/Calculators/A&P                          Patient presenting with lower abdominal pain that began early this morning, described as an intermittent aching pain.  He had a large seafood meal last night.  No vomiting or diarrhea.  No history of abdominal surgery.  His abdomen is generally tender across the lower abdomen, no guarding or rebound.  His vital signs are stable.  Will order blood work and pain medication.  Blood work is unremarkable. Patient continues to have some abdominal discomfort on reevaluation though does feel better with morphine. CT abd/pelvis is negative for acute intra-abdominal pathology.  Suspect this  may be foodborne illness versus viral. Recommend symptomatic management, clear liquids for bowel rest.  Discussed strict return precautions, PCP follow up.  Pt discharged in no distress.  Final Clinical Impression(s) / ED Diagnoses Final diagnoses:  Generalized abdominal pain    Rx / DC Orders ED Discharge Orders         Ordered    dicyclomine (BENTYL) 20 MG tablet  2 times daily PRN        04/28/20 1831           Robinson, [place] N, PA-C 04/28/20 1845    Jacalyn Lefevre, MD 04/28/20 [phone]

## 2020-04-28 NOTE — ED Triage Notes (Signed)
Pt arrives with c/o abdominal pain starting around 4 am today reports he had EMS come out to his house and the pain had eased off some. Pt reports pain is lower abdominal on both sides and radiating into his back. Denies NVD. Denies any urinary symptoms.

## 2020-04-28 NOTE — Discharge Instructions (Addendum)
You can take the Bentyl every 12 hours as needed for abdominal cramping. In addition you can take Tylenol every 4-6 hours. Drink clear liquids until your belly feels better, this will allow your bowels to rest. Follow-up with your primary care provider. If you develop severely worsening pain, high fever, uncontrollable vomiting, you can return to the emergency department for reevaluation.

## 2020-06-16 ENCOUNTER — Encounter (HOSPITAL_COMMUNITY): Payer: Self-pay

## 2020-06-16 ENCOUNTER — Other Ambulatory Visit: Payer: Self-pay

## 2020-06-16 ENCOUNTER — Emergency Department (HOSPITAL_COMMUNITY)
Admission: EM | Admit: 2020-06-16 | Discharge: 2020-06-16 | Disposition: A | Discharge status: Home / Self Care | Payer: Self-pay | Attending: Emergency Medicine | Admitting: Emergency Medicine

## 2020-06-16 DIAGNOSIS — K0889 Other specified disorders of teeth and supporting structures: Secondary | ICD-10-CM | POA: Insufficient documentation

## 2020-06-16 DIAGNOSIS — F17210 Nicotine dependence, cigarettes, uncomplicated: Secondary | ICD-10-CM | POA: Insufficient documentation

## 2020-06-16 MED ORDER — PENICILLIN V POTASSIUM 500 MG PO TABS: 500.0000 mg | ORAL_TABLET | Freq: Four times a day (QID) | ORAL | 0 refills | Status: DC

## 2020-06-16 MED ORDER — CHLORHEXIDINE GLUCONATE 0.12 % MT SOLN: 15.0000 mL | Freq: Two times a day (BID) | OROMUCOSAL | 0 refills | Status: AC

## 2020-06-16 MED ORDER — KETOROLAC TROMETHAMINE 30 MG/ML IJ SOLN
30.0000 mg | Freq: Once | INTRAMUSCULAR | Status: AC
  Administered 2020-06-16: 30 mg via INTRAMUSCULAR
  Filled 2020-06-16: qty 1

## 2020-06-16 NOTE — ED Provider Notes (Signed)
[facility]-EMERGENCY DEPT Provider Note   CSN: [id number] Arrival date & time: 06/16/20  0436     History   Chief Complaint No chief complaint on file.   HPI Marco Atkinson is a 25 y.o. male.  Patient presents to the emergency department with a dental complaint. Symptoms awakened him from sleep tonight, but this is not a new problem.   Pain rated as severe, characterized as throbbing in nature and located right lower molar. Patient denies fever, night sweats, chills, difficulty swallowing or opening mouth, SOB, nuchal rigidity or decreased ROM of neck.  Patient does not have a dentist and requests a resource guide at discharge.      HPI  History reviewed. No pertinent past medical history.  There are no problems to display for this patient.   Past Surgical History:  Procedure Laterality Date  . NECK SURGERY          Home Medications    Prior to Admission medications   Medication Sig Start Date End Date Taking? Authorizing Provider  dicyclomine (BENTYL) 20 MG tablet Take 1 tablet (20 mg total) by mouth 2 (two) times daily as needed (abdominal cramping). 04/28/20   Robinson, [place] N, PA-C  naproxen (NAPROSYN) 500 MG tablet Take 1 tablet (500 mg total) by mouth 2 (two) times daily. 04/09/19   Wieters, Hallie C, PA-C  naproxen (NAPROSYN) 500 MG tablet Take 1 tablet (500 mg total) by mouth 2 (two) times daily. 11/13/19   Horton, Mayer Masker, MD  penicillin v potassium (VEETID) 500 MG tablet Take 1 tablet (500 mg total) by mouth 4 (four) times daily. 11/13/19   Horton, Mayer Masker, MD  fluticasone (FLONASE) 50 MCG/ACT nasal spray Place 1 spray into both nostrils daily. 12/12/18 04/09/19  Gailen Shelter, PA    Family History Family History  Problem Relation Age of Onset  . Healthy Mother   . Healthy Father     Social History Social History   Tobacco Use  . Smoking status: Current Every Day Smoker    Types: Cigarettes  . Smokeless tobacco: Never  Used  Vaping Use  . Vaping Use: Never used  Substance Use Topics  . Alcohol use: Yes    Comment: weekly  . Drug use: Yes    Types: Marijuana     Allergies   Patient has no known allergies.   Review of Systems Review of Systems  Constitutional: Negative for chills and fever.  HENT: Positive for dental problem. Negative for drooling.   Neurological: Negative for speech difficulty.  Psychiatric/Behavioral: Positive for sleep disturbance.     Physical Exam Updated Vital Signs BP (!) 139/101 (BP Location: Right Arm)   Pulse 68   Temp 97.7 F (36.5 C) (Oral)   Resp 17   SpO2 100%   Physical Exam Physical Exam  Constitutional: Pt appears well-developed and well-nourished.  HENT:  Head: Normocephalic.  Right Ear: Tympanic membrane, external ear and ear canal normal.  Left Ear: Tympanic membrane, external ear and ear canal normal.  Nose: Nose normal. Right sinus exhibits no maxillary sinus tenderness and no frontal sinus tenderness. Left sinus exhibits no maxillary sinus tenderness and no frontal sinus tenderness.  Mouth/Throat: Uvula is midline, oropharynx is clear and moist and mucous membranes are normal. No oral lesions. No uvula swelling or lacerations. No oropharyngeal exudate, posterior oropharyngeal edema, posterior oropharyngeal erythema or tonsillar abscesses.  Poor dentition No gingival swelling, fluctuance or induration No gross abscess  No sublingual edema, tenderness  to palpation, or sign of Ludwig's angina, or deep space infection Pain at tooth number 30 Eyes: Conjunctivae are normal. Pupils are equal, round, and reactive to light. Right eye exhibits no discharge. Left eye exhibits no discharge.  Neck: Normal range of motion. Neck supple.  No stridor Handling secretions without difficulty No nuchal rigidity No cervical lymphadenopathy Cardiovascular: Normal rate, regular rhythm and normal heart sounds.   Pulmonary/Chest: Effort normal. No respiratory  distress.  Equal chest rise  Abdominal: Soft. Bowel sounds are normal. Pt exhibits no distension. There is no tenderness.  Lymphadenopathy: Pt has no cervical adenopathy.  Neurological: Pt is alert and oriented x 4  Skin: Skin is warm and dry.  Psychiatric: Pt has a normal mood and affect.  Nursing note and vitals reviewed.   ED Treatments / Results  Labs (all labs ordered are listed, but only abnormal results are displayed) Labs Reviewed - No data to display  EKG    Radiology No results found.  Procedures Procedures (including critical care time)  Medications Ordered in ED Medications - No data to display   Initial Impression / Assessment and Plan / ED Course  I have reviewed the triage vital signs and the nursing notes.  Pertinent labs & imaging results that were available during my care of the patient were reviewed by me and considered in my medical decision making (see chart for details).        Patient with dentalgia.  No abscess requiring immediate incision and drainage.  Exam not concerning for Ludwig's angina or pharyngeal abscess.  Will treat with penicillin and peridex. Pt instructed to follow-up with dentist.  Discussed return precautions. Pt safe for discharge.   Final Clinical Impressions(s) / ED Diagnoses   Final diagnoses:  Pain, dental    ED Discharge Orders    None       Roxy Horseman, PA-C 06/16/20 0455    Molpus, Jonny Ruiz, MD 06/16/20 [phone]

## 2020-06-16 NOTE — ED Notes (Signed)
Pt verbalized understanding of d/c, medication, and follow up care. Ambulatory with steady gait.  

## 2020-06-16 NOTE — ED Triage Notes (Signed)
Pt arrives via [facility] from home. He reports pain in right lower gum woke him up from his sleep.

## 2020-06-20 ENCOUNTER — Telehealth: Payer: Self-pay | Admitting: *Deleted

## 2020-06-20 NOTE — Telephone Encounter (Signed)
TOC CM received call from pt states he attempted to make an appt with dentist, Dr Mayford Knife that he was referred to and he office states his referral expired. He was suppose to make appt within 48 hours. Contacted Dr Norris Cross office and states they are no longer on call and will not accept referral. TOC CM contacted ED provider and currently not available. Contacted pt to make aware. Explained to follow up with dentist at [facility]. Isidoro Donning RN CCM, [facility] ED TOC CM [phone]

## 2020-09-04 ENCOUNTER — Encounter (HOSPITAL_BASED_OUTPATIENT_CLINIC_OR_DEPARTMENT_OTHER): Payer: Self-pay | Admitting: Emergency Medicine

## 2020-09-04 ENCOUNTER — Emergency Department (HOSPITAL_BASED_OUTPATIENT_CLINIC_OR_DEPARTMENT_OTHER)
Admission: EM | Admit: 2020-09-04 | Discharge: 2020-09-04 | Disposition: A | Discharge status: Home / Self Care | Payer: Self-pay | Attending: Emergency Medicine | Admitting: Emergency Medicine

## 2020-09-04 ENCOUNTER — Other Ambulatory Visit: Payer: Self-pay

## 2020-09-04 DIAGNOSIS — K029 Dental caries, unspecified: Secondary | ICD-10-CM | POA: Insufficient documentation

## 2020-09-04 DIAGNOSIS — F17210 Nicotine dependence, cigarettes, uncomplicated: Secondary | ICD-10-CM | POA: Insufficient documentation

## 2020-09-04 MED ORDER — NAPROXEN 250 MG PO TABS
500.0000 mg | ORAL_TABLET | Freq: Once | ORAL | Status: AC
  Administered 2020-09-04: 04:00:00 500 mg via ORAL
  Filled 2020-09-04: qty 2

## 2020-09-04 MED ORDER — PENICILLIN V POTASSIUM 250 MG PO TABS
500.0000 mg | ORAL_TABLET | Freq: Once | ORAL | Status: AC
  Administered 2020-09-04: 04:00:00 500 mg via ORAL
  Filled 2020-09-04: qty 2

## 2020-09-04 MED ORDER — PENICILLIN V POTASSIUM 500 MG PO TABS: 500.0000 mg | ORAL_TABLET | Freq: Four times a day (QID) | ORAL | 0 refills | Status: AC

## 2020-09-04 MED ORDER — NAPROXEN 375 MG PO TABS: 375.0000 mg | ORAL_TABLET | Freq: Two times a day (BID) | ORAL | 0 refills | Status: DC

## 2020-09-04 MED ORDER — LIDOCAINE VISCOUS HCL 2 % MT SOLN
15.0000 mL | Freq: Once | OROMUCOSAL | Status: AC
  Administered 2020-09-04: 04:00:00 15 mL via OROMUCOSAL
  Filled 2020-09-04: qty 15

## 2020-09-04 NOTE — ED Provider Notes (Signed)
[facility] EMERGENCY DEPARTMENT Provider Note   CSN: [id number] Arrival date & time: 09/04/20  0407     History Chief Complaint  Patient presents with  . Dental Pain    Marco Atkinson is a 25 y.o. male.  The history is provided by the patient.  Dental Pain Location:  Upper and lower Upper teeth location:  1/RU 3rd molar, 16/LU 3rd molar and 2/RU 2nd molar Lower teeth location:  17/LL 3rd molar and 32/RL 3rd molar Quality:  Aching Severity:  Severe Onset quality:  Gradual Duration: several days. Chronicity:  Recurrent Context: dental caries, dental fracture and poor dentition   Previous work-up:  Dental exam Relieved by:  Nothing Worsened by:  Nothing Ineffective treatments:  None tried Associated symptoms: no difficulty swallowing, no drooling, no facial swelling, no fever, no neck pain, no neck swelling, no oral bleeding and no oral lesions       History reviewed. No pertinent past medical history.  There are no problems to display for this patient.   Past Surgical History:  Procedure Laterality Date  . NECK SURGERY         Family History  Problem Relation Age of Onset  . Healthy Mother   . Healthy Father     Social History   Tobacco Use  . Smoking status: Every Day    Pack years: 0.00    Types: Cigarettes  . Smokeless tobacco: Never  Vaping Use  . Vaping Use: Never used  Substance Use Topics  . Alcohol use: Yes    Comment: weekly  . Drug use: Yes    Types: Marijuana    Home Medications Prior to Admission medications   Medication Sig Start Date End Date Taking? Authorizing Provider  naproxen (NAPROSYN) 375 MG tablet Take 1 tablet (375 mg total) by mouth 2 (two) times daily with a meal. 09/04/20  Yes Cayman Kielbasa, MD  penicillin v potassium (VEETID) 500 MG tablet Take 1 tablet (500 mg total) by mouth 4 (four) times daily for 7 days. 09/04/20 09/11/20 Yes Deyonna Fitzsimmons, MD  chlorhexidine (PERIDEX) 0.12 % solution Use as directed 15  mLs in the mouth or throat 2 (two) times daily. 06/16/20   Roxy Horseman, PA-C  dicyclomine (BENTYL) 20 MG tablet Take 1 tablet (20 mg total) by mouth 2 (two) times daily as needed (abdominal cramping). 04/28/20   Robinson, [place] N, PA-C  naproxen (NAPROSYN) 500 MG tablet Take 1 tablet (500 mg total) by mouth 2 (two) times daily. 04/09/19   Wieters, Hallie C, PA-C  naproxen (NAPROSYN) 500 MG tablet Take 1 tablet (500 mg total) by mouth 2 (two) times daily. 11/13/19   Horton, Mayer Masker, MD  penicillin v potassium (VEETID) 500 MG tablet Take 1 tablet (500 mg total) by mouth 4 (four) times daily. 06/16/20   Roxy Horseman, PA-C  fluticasone (FLONASE) 50 MCG/ACT nasal spray Place 1 spray into both nostrils daily. 12/12/18 04/09/19  Gailen Shelter, PA    Allergies    Patient has no known allergies.  Review of Systems   Review of Systems  Constitutional:  Negative for fever.  HENT:  Positive for dental problem. Negative for drooling, facial swelling and mouth sores.   Eyes:  Negative for redness.  Respiratory:  Negative for chest tightness.   Gastrointestinal:  Negative for vomiting.  Genitourinary:  Negative for difficulty urinating.  Musculoskeletal:  Negative for neck pain and neck stiffness.  Skin:  Negative for rash.  Neurological:  Negative for facial  asymmetry.  Psychiatric/Behavioral:  Negative for agitation.   All other systems reviewed and are negative.  Physical Exam Updated Vital Signs BP (!) 136/95 (BP Location: Right Arm)   Pulse 92   Temp 98.5 F (36.9 C) (Oral)   Resp 20   Ht 5\' 6"  (1.676 m)   Wt 68.9 kg   SpO2 100%   BMI 24.53 kg/m   Physical Exam Vitals and nursing note reviewed.  Constitutional:      General: He is not in acute distress.    Appearance: Normal appearance.  HENT:     Head: Normocephalic and atraumatic.     Nose: Nose normal.     Mouth/Throat:     Mouth: Mucous membranes are moist.     Dentition: Abnormal dentition. Dental caries present. No  dental abscesses.     Pharynx: Oropharynx is clear. No uvula swelling.     Comments: No facial swelling no submandibular swelling  Eyes:     Conjunctiva/sclera: Conjunctivae normal.     Pupils: Pupils are equal, round, and reactive to light.  Cardiovascular:     Rate and Rhythm: Normal rate and regular rhythm.     Pulses: Normal pulses.     Heart sounds: Normal heart sounds.  Pulmonary:     Effort: Pulmonary effort is normal.     Breath sounds: Normal breath sounds.  Abdominal:     General: Abdomen is flat. Bowel sounds are normal.     Palpations: Abdomen is soft.     Tenderness: There is no abdominal tenderness. There is no guarding.  Musculoskeletal:        General: Normal range of motion.     Cervical back: Normal range of motion and neck supple.  Lymphadenopathy:     Cervical: No cervical adenopathy.  Skin:    General: Skin is warm and dry.     Capillary Refill: Capillary refill takes less than 2 seconds.  Neurological:     General: No focal deficit present.     Mental Status: He is alert and oriented to person, place, and time.     Deep Tendon Reflexes: Reflexes normal.  Psychiatric:        Mood and Affect: Mood normal.        Behavior: Behavior normal.    ED Results / Procedures / Treatments   Labs (all labs ordered are listed, but only abnormal results are displayed) Labs Reviewed - No data to display  EKG None  Radiology No results found.  Procedures Procedures   Medications Ordered in ED Medications  naproxen (NAPROSYN) tablet 500 mg (has no administration in time range)  penicillin v potassium (VEETID) tablet 500 mg (has no administration in time range)  lidocaine (XYLOCAINE) 2 % viscous mouth solution 15 mL (has no administration in time range)    ED Course  I have reviewed the triage vital signs and the nursing notes.  Pertinent labs & imaging results that were available during my care of the patient were reviewed by me and considered in my  medical decision making (see chart for details).   Has been seen for same and not followed up for dentristry.  Have advised close follow up with dentist as the ER does not do dental Xrays nor routine dental care.  Stable for discharge with close follow up.  Resource guide given.    Marco Atkinson was evaluated in Emergency Department on 09/04/2020 for the symptoms described in the history of present illness. He was evaluated in  the context of the global COVID-19 pandemic, which necessitated consideration that the patient might be at risk for infection with the SARS-CoV-2 virus that causes COVID-19. Institutional protocols and algorithms that pertain to the evaluation of patients at risk for COVID-19 are in a state of rapid change based on information released by regulatory bodies including the CDC and federal and state organizations. These policies and algorithms were followed during the patient's care in the ED.    Final Clinical Impression(s) / ED Diagnoses Final diagnoses:  Pain due to dental caries   Return for intractable cough, coughing up blood, fevers > 100.4 unrelieved by medication, shortness of breath, intractable vomiting, chest pain, shortness of breath, weakness, numbness, changes in speech, facial asymmetry, abdominal pain, passing out, Inability to tolerate liquids or food, cough, altered mental status or any concerns. No signs of systemic illness or infection. The patient is nontoxic-appearing on exam and vital signs are within normal limits. I have reviewed the triage vital signs and the nursing notes. Pertinent labs & imaging results that were available during my care of the patient were reviewed by me and considered in my medical decision making (see chart for details). After history, exam, and medical workup I feel the patient has been appropriately medically screened and is safe for discharge home. Pertinent diagnoses were discussed with the patient. Patient was given return  precautions.  Rx / DC Orders ED Discharge Orders          Ordered    penicillin v potassium (VEETID) 500 MG tablet  4 times daily        09/04/20 0412    naproxen (NAPROSYN) 375 MG tablet  2 times daily with meals        09/04/20 0412             Jatorian Renault, MD 09/04/20 [id number]

## 2021-07-18 IMAGING — DX DG KNEE COMPLETE 4+V*L*
4 series · 4 of 4 positions shown · non-contrast
Comparison: None.

CLINICAL DATA: Left knee pain

EXAM:
LEFT KNEE - COMPLETE 4+ VIEW

[knee ap]
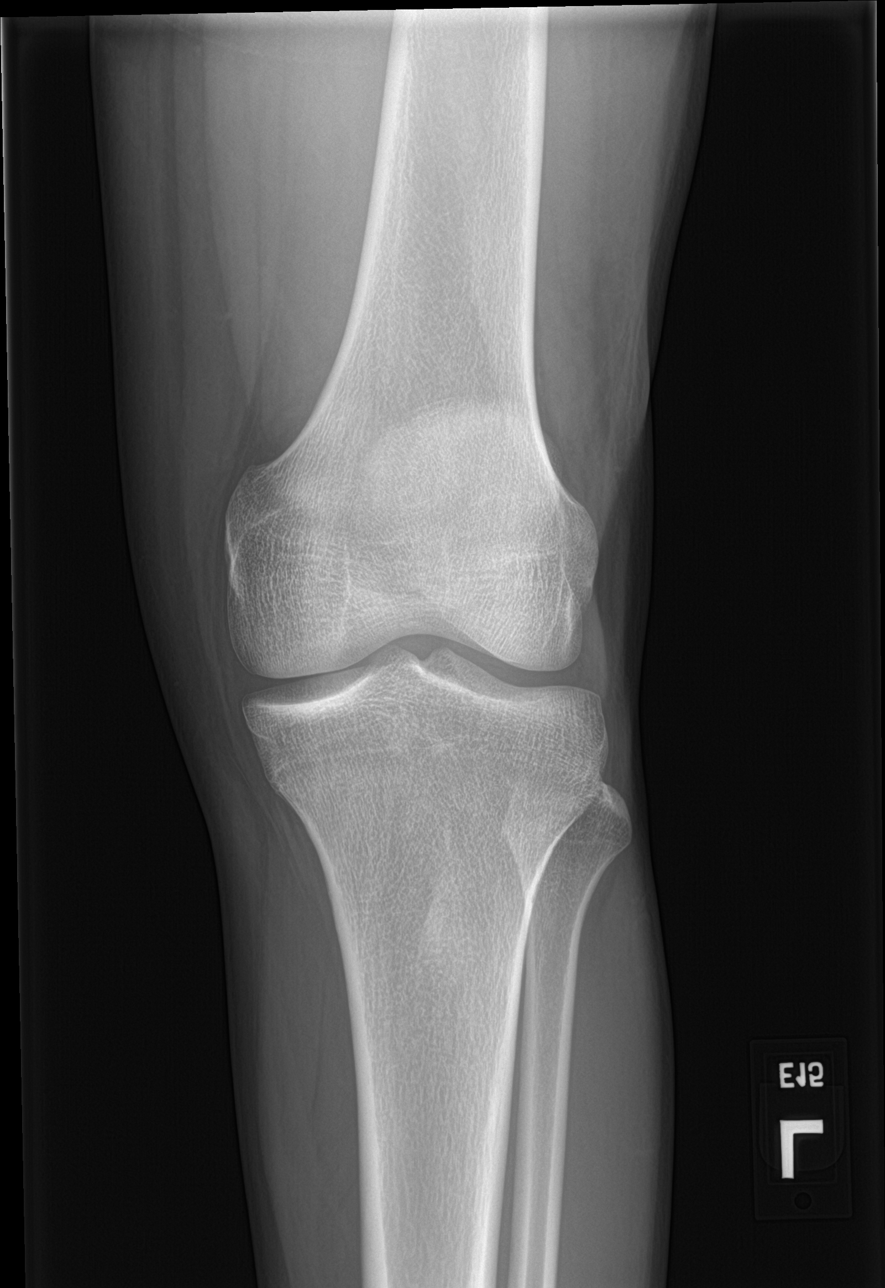

[knee obl (1 of 2)]
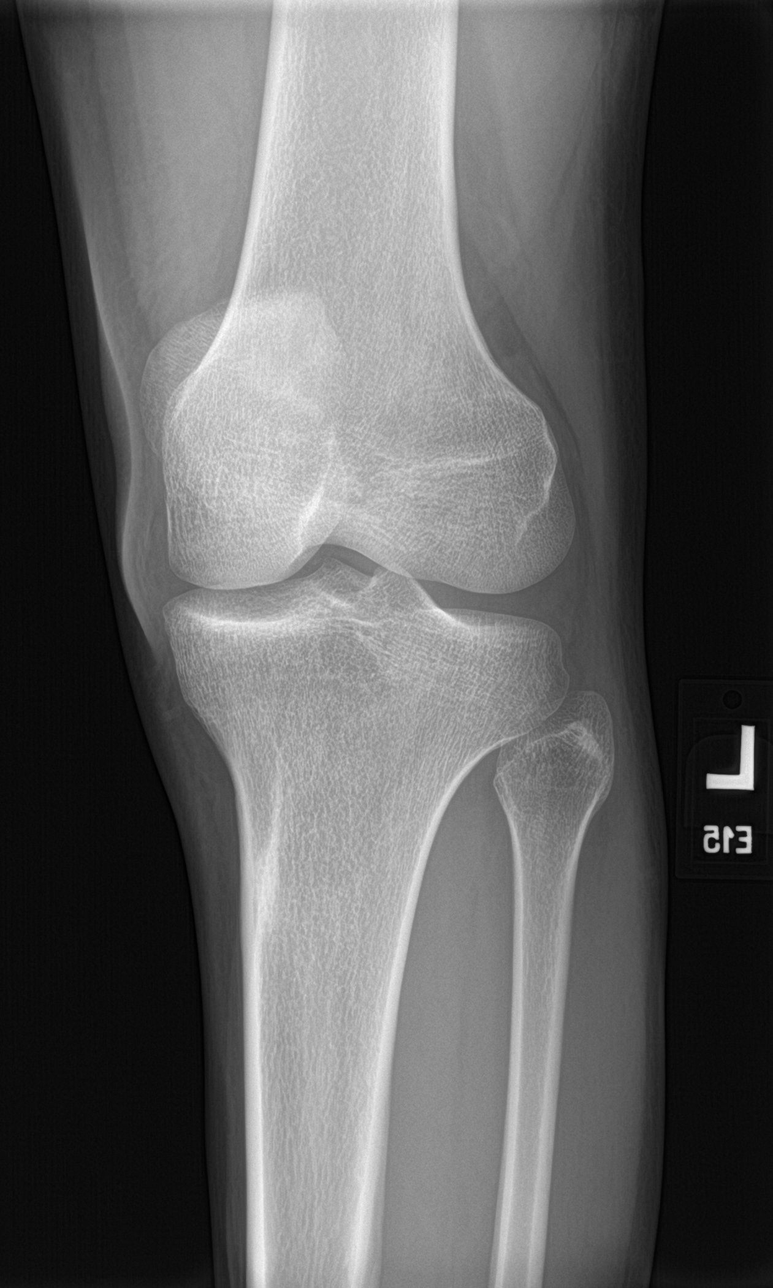

[knee obl (2 of 2)]
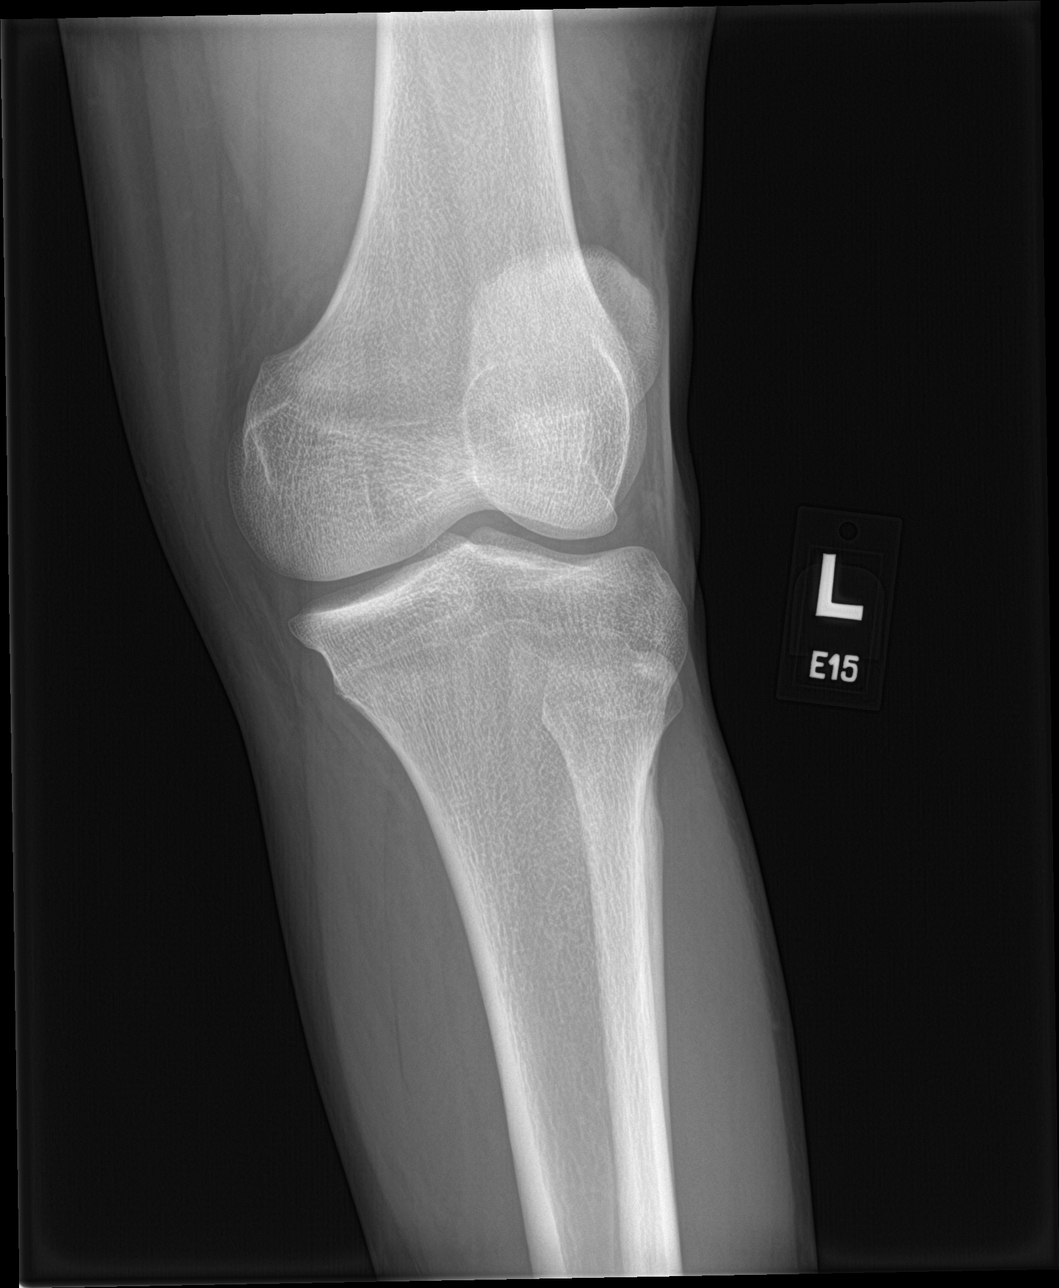

[knee lat]
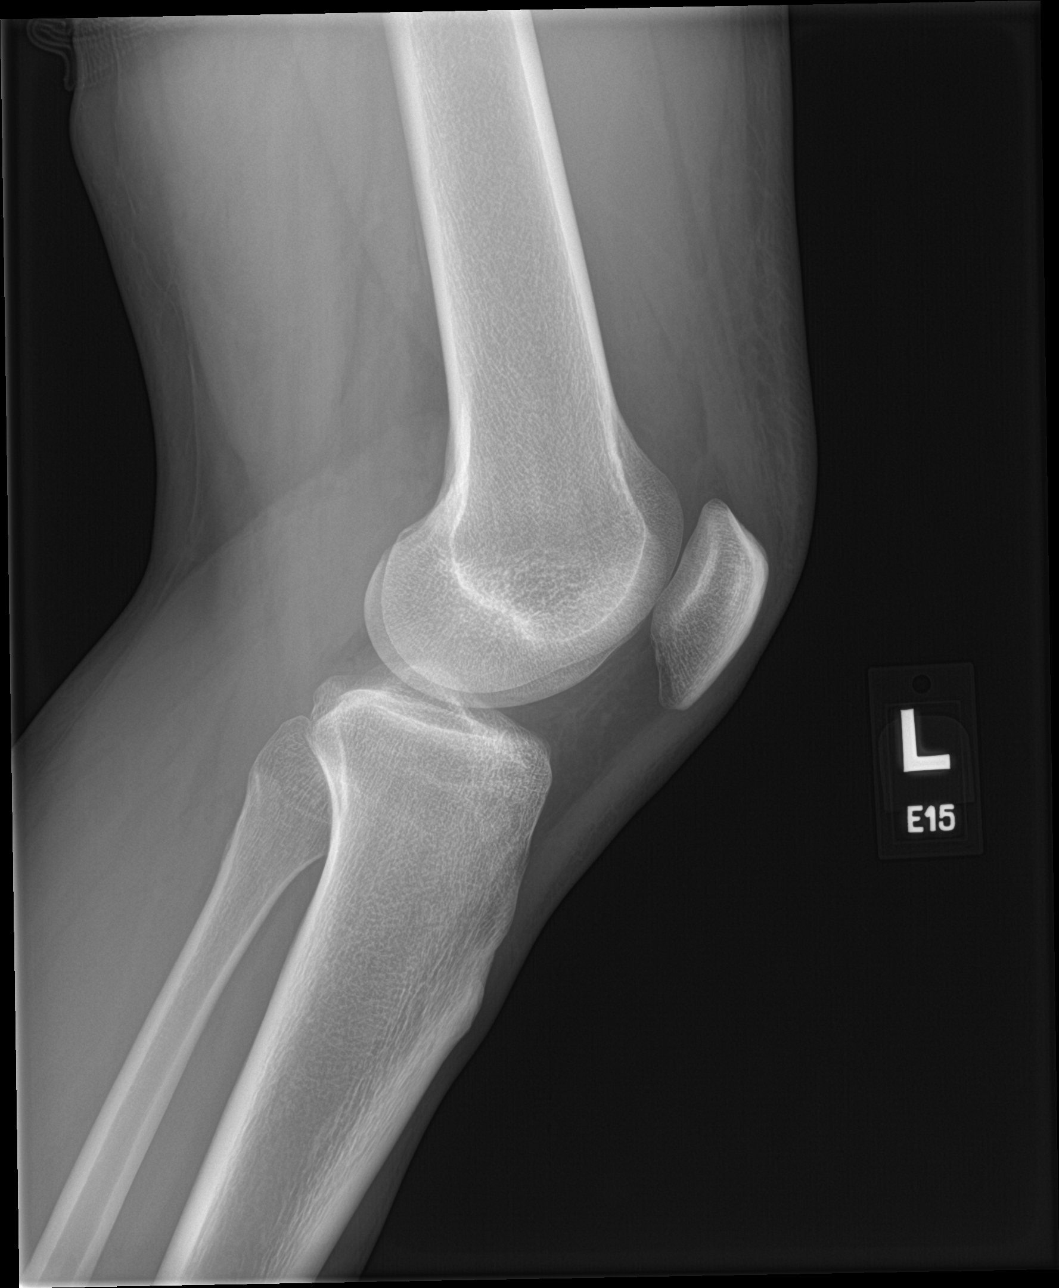

[4 of 4 positions shown; findings below may reference images not displayed]

FINDINGS: No acute displaced fracture or malalignment. Probable knee effusion.
Joint spaces are maintained
IMPRESSION: Probable knee effusion.

## 2021-11-03 IMAGING — CT CT ABD-PELV W/ CM
2 of 3 series · 16 of 43 positions shown, 18 images · IV contrast (Omnipaque)
Comparison: None.

CLINICAL DATA: Acute abdominal pain beginning this morning.

EXAM:
CT ABDOMEN AND PELVIS WITH CONTRAST
TECHNIQUE: Multidetector CT imaging of the abdomen and pelvis was performed
using the standard protocol following bolus administration of
intravenous contrast.
CONTRAST:  100mL OMNIPAQUE IOHEXOL 300 MG/ML  SOLN

[Series 3: lung bases · axial · 0.69mm/px · z∈[-152,-32]mm · 13 of 29 slices shown, 15 images]
[im 3/29  soft-tissue]
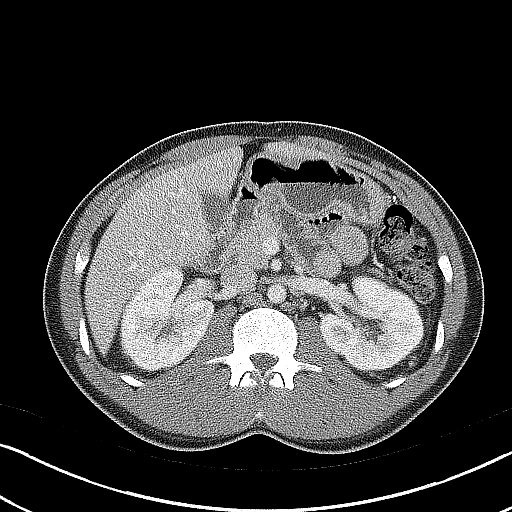
[im 3/29  bone]
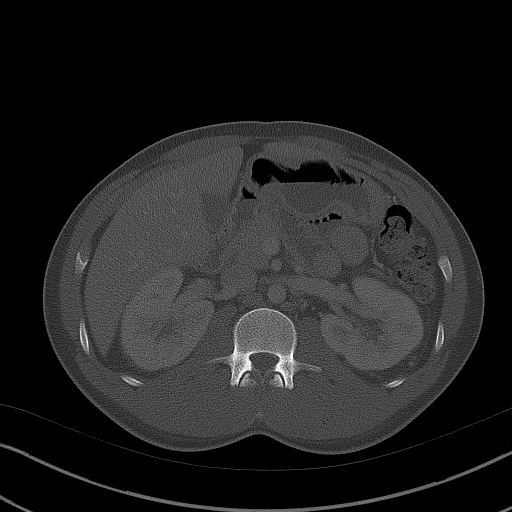
[im 5/29  soft-tissue]
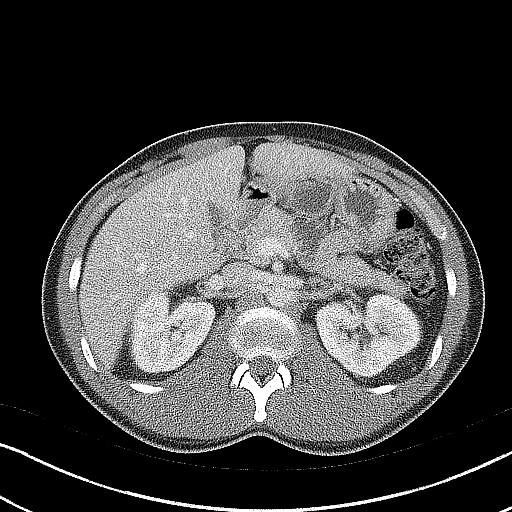
[im 7/29  soft-tissue]
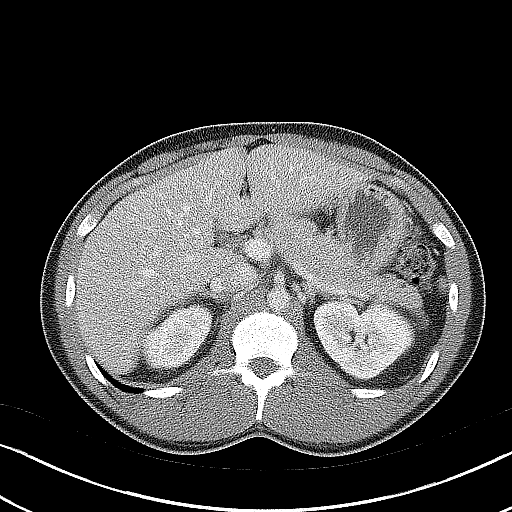
[im 9/29  soft-tissue]
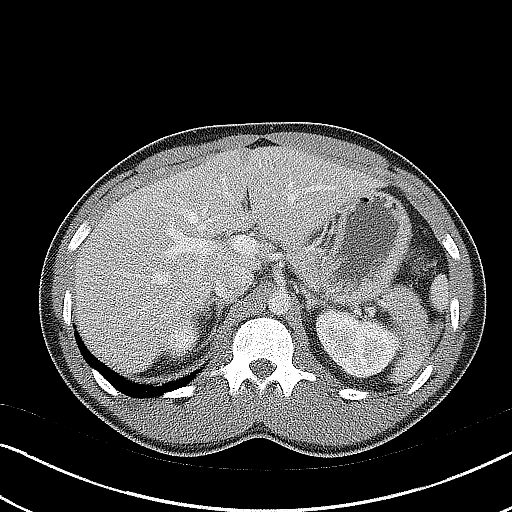
[im 11/29  soft-tissue]
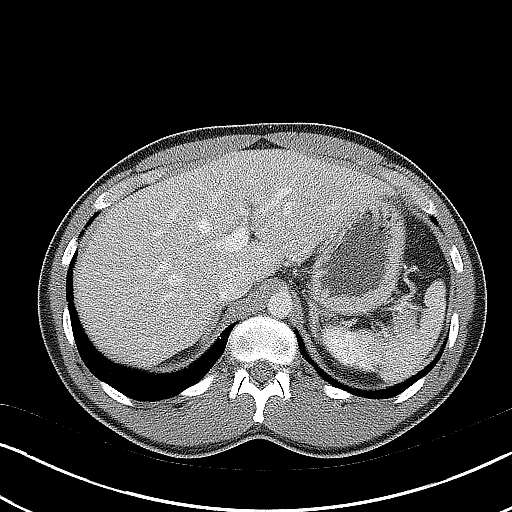
[im 13/29  soft-tissue]
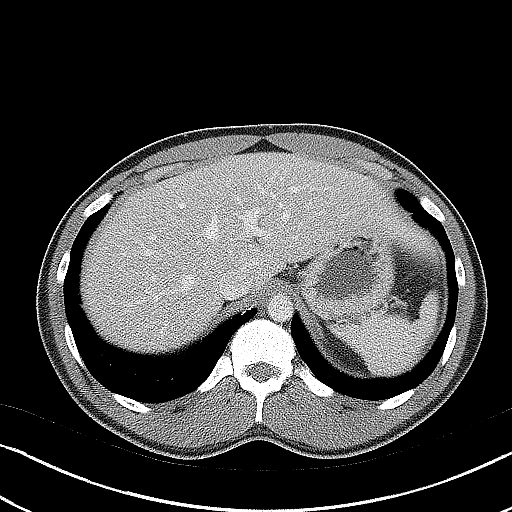
[im 15/29  soft-tissue]
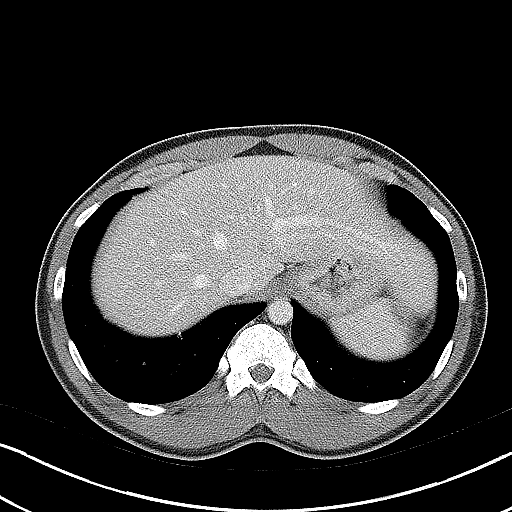
[im 17/29  soft-tissue]
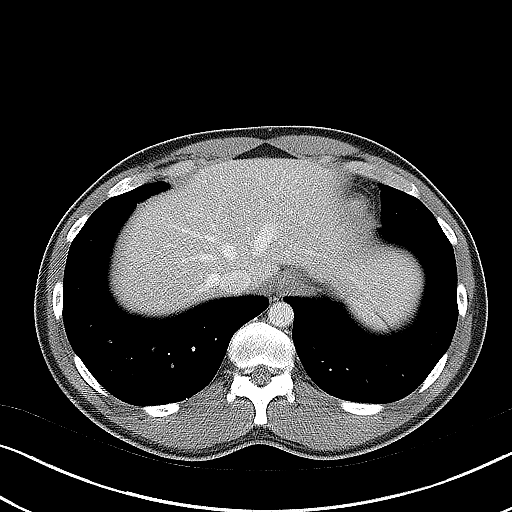
[im 19/29  soft-tissue]
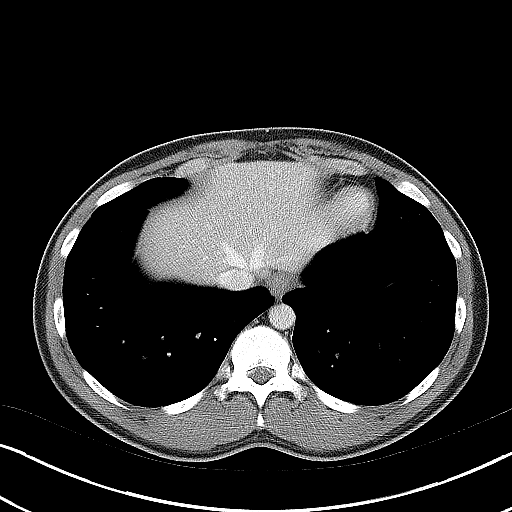
[im 19/29  bone]
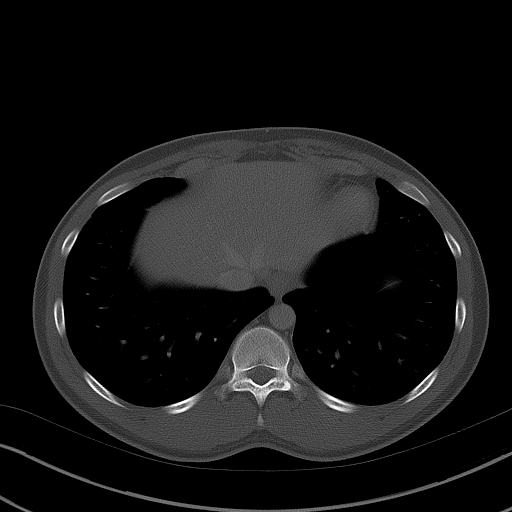
[im 21/29  soft-tissue]
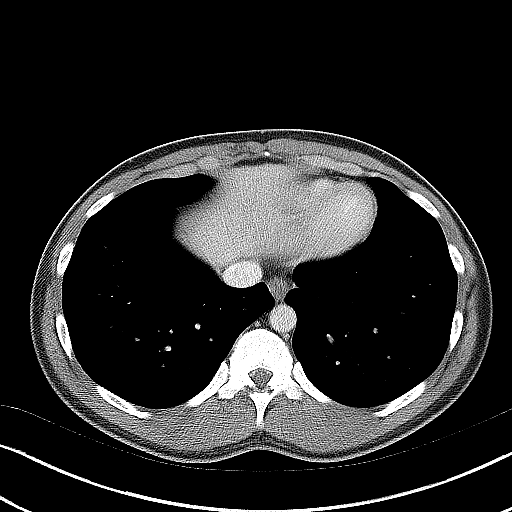
[im 23/29  soft-tissue]
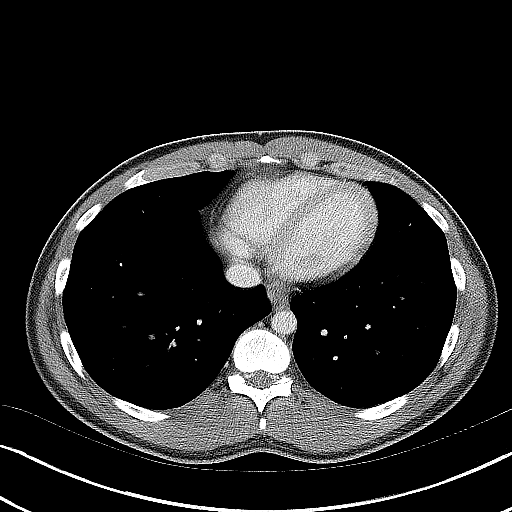
[im 25/29  soft-tissue]
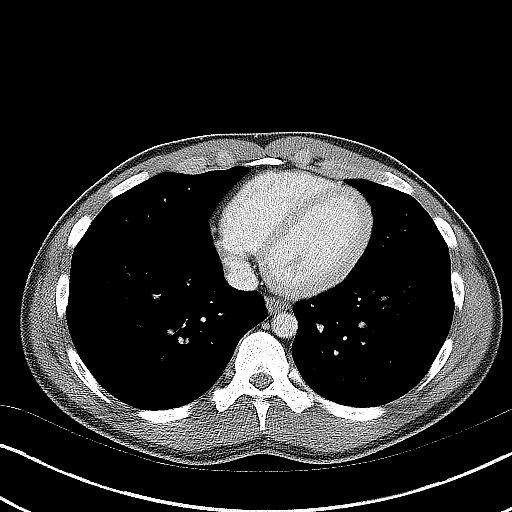
[im 27/29  soft-tissue]
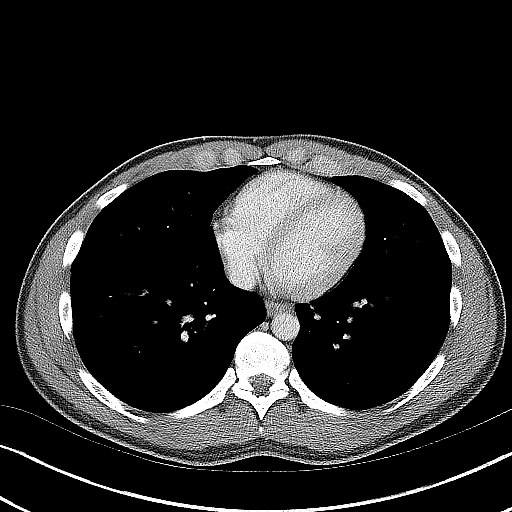

[Series 5: coronal st · coronal · 0.61mm/px · 3 of 76 slices shown]
[im 26/76  soft-tissue]
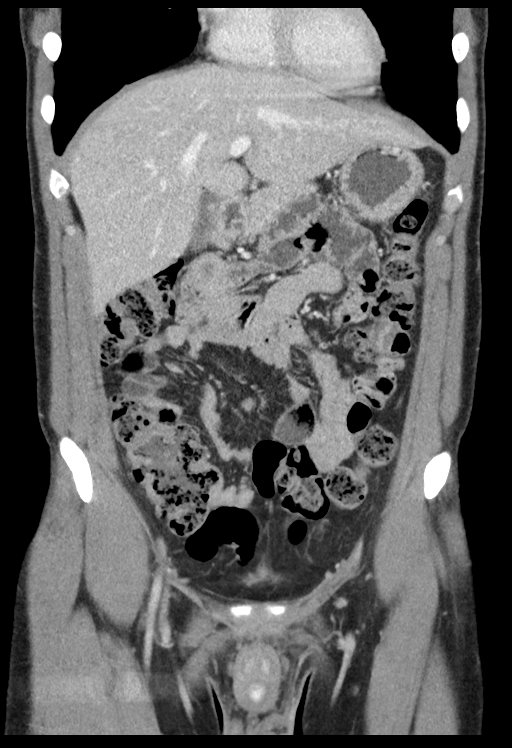
[im 34/76  soft-tissue]
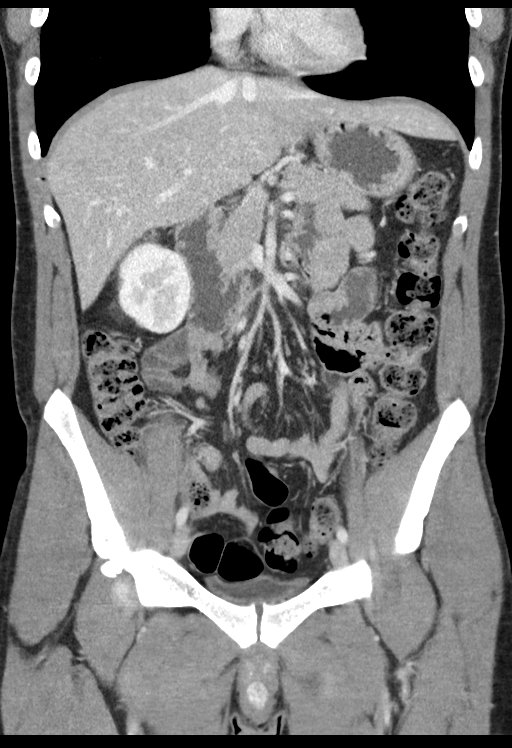
[im 42/76  soft-tissue]
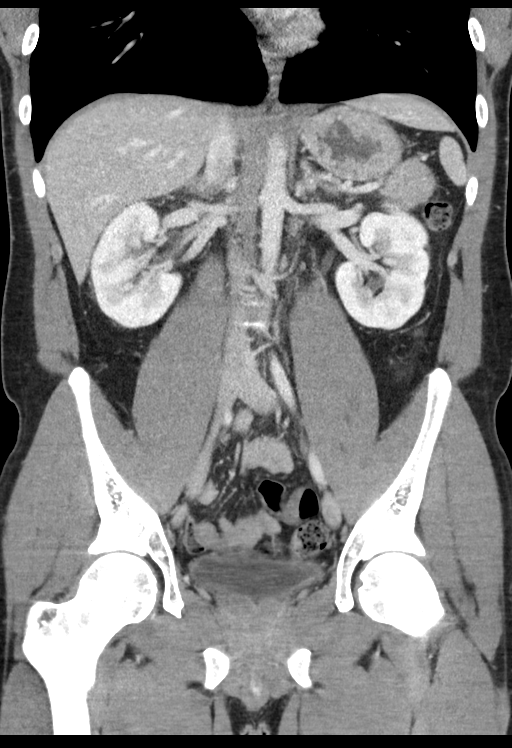

[16 of 43 positions shown; findings below may reference images not displayed]

FINDINGS: Lower Chest: No acute findings.

Hepatobiliary: No hepatic masses identified. Gallbladder is
unremarkable. No evidence of biliary ductal dilatation.

Pancreas:  No mass or inflammatory changes.

Spleen: Within normal limits in size and appearance.

Adrenals/Urinary Tract: No masses identified. No evidence of
ureteral calculi or hydronephrosis.

Stomach/Bowel: No evidence of obstruction, inflammatory process or
abnormal fluid collections. Normal appendix visualized.

Vascular/Lymphatic: No pathologically enlarged lymph nodes. No
abdominal aortic aneurysm.

Reproductive:  No mass or other significant abnormality.

Other:  None.

Musculoskeletal:  No suspicious bone lesions identified.
IMPRESSION: Negative. No acute findings or other significant abnormality.

## 2022-01-24 ENCOUNTER — Emergency Department (HOSPITAL_COMMUNITY)
Admission: EM | Admit: 2022-01-24 | Discharge: 2022-01-24 | Disposition: A | Discharge status: Home / Self Care | Payer: BC Managed Care – PPO | Attending: Student | Admitting: Student

## 2022-01-24 ENCOUNTER — Emergency Department (HOSPITAL_COMMUNITY): Payer: BC Managed Care – PPO

## 2022-01-24 DIAGNOSIS — M7918 Myalgia, other site: Secondary | ICD-10-CM

## 2022-01-24 DIAGNOSIS — Y92410 Unspecified street and highway as the place of occurrence of the external cause: Secondary | ICD-10-CM | POA: Insufficient documentation

## 2022-01-24 DIAGNOSIS — M545 Low back pain, unspecified: Secondary | ICD-10-CM | POA: Diagnosis not present

## 2022-01-24 DIAGNOSIS — S0990XA Unspecified injury of head, initial encounter: Secondary | ICD-10-CM | POA: Insufficient documentation

## 2022-01-24 DIAGNOSIS — M25561 Pain in right knee: Secondary | ICD-10-CM | POA: Insufficient documentation

## 2022-01-24 MED ORDER — HYDROCODONE-ACETAMINOPHEN 5-325 MG PO TABS
1.0000 | ORAL_TABLET | Freq: Once | ORAL | Status: AC
  Administered 2022-01-24: 1 via ORAL
  Filled 2022-01-24: qty 1

## 2022-01-24 MED ORDER — METHOCARBAMOL 500 MG PO TABS: 500.0000 mg | ORAL_TABLET | Freq: Two times a day (BID) | ORAL | 0 refills | Status: AC
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
## 2022-01-24 NOTE — Discharge Instructions (Signed)
Motrin and Tylenol as needed as directed for pain.  Can take Robaxin as needed as prescribed for muscle tightness.  Do not drive or operate machinery if taking Robaxin.  Recommend warm compresses to sore muscles for 20 minutes at a time, follow with gentle stretching.  Can wear knee sleeve as needed for comfort.  Recheck with your doctor in 3 to 5 days if pain is not improving.  If you do not have a doctor, please see the referral line provided in the paperwork tonight.

## 2022-05-19 ENCOUNTER — Emergency Department (HOSPITAL_COMMUNITY)
Admission: EM | Admit: 2022-05-19 | Discharge: 2022-05-19 | Disposition: A | Discharge status: Home / Self Care | Payer: BC Managed Care – PPO | Attending: Emergency Medicine | Admitting: Emergency Medicine

## 2022-05-19 ENCOUNTER — Encounter (HOSPITAL_COMMUNITY): Payer: Self-pay

## 2022-05-19 ENCOUNTER — Other Ambulatory Visit: Payer: Self-pay

## 2022-05-19 DIAGNOSIS — R1310 Dysphagia, unspecified: Secondary | ICD-10-CM | POA: Diagnosis present

## 2022-05-19 DIAGNOSIS — J020 Streptococcal pharyngitis: Secondary | ICD-10-CM | POA: Diagnosis not present

## 2022-05-19 LAB — GROUP A STREP BY PCR: Group A Strep by PCR: DETECTED — AB

## 2022-05-19 MED ORDER — PENICILLIN G BENZATHINE 1200000 UNIT/2ML IM SUSY
1.2000 10*6.[IU] | PREFILLED_SYRINGE | Freq: Once | INTRAMUSCULAR | Status: AC
Start: 2022-05-19 — End: 2022-05-19
  Administered 2022-05-19: 1.2 10*6.[IU] via INTRAMUSCULAR
  Filled 2022-05-19: qty 2

## 2022-05-19 NOTE — ED Provider Triage Note (Signed)
Emergency Medicine Provider Triage Evaluation Note  Travyon Cooling , a 27 y.o. male  was evaluated in triage.  Pt complains of sore throat for 3 days.  Patient also endorsed fever, chills, night sweats.  Patient able to tolerate food and fluids orally.  Patient states Tylenol ibuprofen have not helped.  Patient states it hurts to swallow but is able to tolerate secretions.  Patient had chest pain, shortness of breath, nausea/vomiting, syncope, vision changes, sick contacts  Review of Systems  Positive: See HPI Negative: See HPI  Physical Exam  BP 118/73   Pulse 88   Temp 98.7 F (37.1 C) (Oral)   Resp 16   Ht 5\' 6"  (1.676 m)   Wt 68.9 kg   SpO2 100%   BMI 24.52 kg/m  Gen:   Awake, no distress   Resp:  Normal effort  MSK:   Moves extremities without difficulty  Other:  Anterior cervical lymphadenopathy, tonsils appeared erythematous and edematous, abdomen nontender to palpation, no splenomegaly noted, oral floor is not elevated  Medical Decision Making  Medically screening exam initiated at 7:07 PM.  Appropriate orders placed.  Ante Cansler was informed that the remainder of the evaluation will be completed by another provider, this initial triage assessment does not replace that evaluation, and the importance of remaining in the ED until their evaluation is complete.  Suspect patient has group A strep and will be discharged on antibiotics, patient stable at this time   Elvina Sidle 05/19/22 1914

## 2022-05-19 NOTE — Discharge Instructions (Signed)
Please read and follow all provided instructions.  Your diagnoses today include:  1. Strep throat    Tests performed today include: Strep test: was POSITIVE for strep throat Vital signs. See below for your results today.   Medications prescribed:  You were given a one-time shot of penicillin to treat your strep throat.   Take any medications prescribed only as directed.   Home care instructions:  Please read the educational materials provided and follow any instructions contained in this packet.  Follow-up instructions: Please follow-up with your primary care provider as needed for further evaluation of your symptoms.  Return instructions:  Please return to the Emergency Department if you experience worsening symptoms.  Return if you have worsening problems swallowing, your neck becomes swollen, you cannot swallow your saliva or your voice becomes muffled.  Return with high persistent fever, persistent vomiting, or if you have trouble breathing.  Please return if you have any other emergent concerns.  Additional Information:  Your vital signs today were: BP 118/73   Pulse 88   Temp 98.7 F (37.1 C) (Oral)   Resp 16   Ht 5\' 6"  (1.676 m)   Wt 68.9 kg   SpO2 100%   BMI 24.52 kg/m  If your blood pressure (BP) was elevated above 135/85 this visit, please have this repeated by your doctor within one month. --------------

## 2022-05-19 NOTE — ED Provider Notes (Signed)
[facility] Provider Note   CSN: [id number] Arrival date & time: 05/19/22  1810     History {Add pertinent medical, surgical, social history, OB history to HPI:1} Chief Complaint  Patient presents with   Sore Throat    Marco Atkinson is a 27 y.o. male.  Patient presents to the emergency department today for evaluation of sore throat.  His symptoms started about 3 days ago.  He has pain with swallowing.  No documented fevers, runny nose, ear pain.  No nausea or vomiting.  Denies known sick exposures but is around a lot of other people at his job.  Taking ibuprofen and Tylenol at home.       Home Medications Prior to Admission medications   Medication Sig Start Date End Date Taking? Authorizing Provider  chlorhexidine (PERIDEX) 0.12 % solution Use as directed 15 mLs in the mouth or throat 2 (two) times daily. 06/16/20   Montine Circle, PA-C  dicyclomine (BENTYL) 20 MG tablet Take 1 tablet (20 mg total) by mouth 2 (two) times daily as needed (abdominal cramping). 04/28/20   Robinson, [place] N, PA-C  methocarbamol (ROBAXIN) 500 MG tablet Take 1 tablet (500 mg total) by mouth 2 (two) times daily. 01/24/22   Tacy Learn, PA-C  fluticasone (FLONASE) 50 MCG/ACT nasal spray Place 1 spray into both nostrils daily. 12/12/18 04/09/19  Tedd Sias, PA      Allergies    Patient has no known allergies.    Review of Systems   Review of Systems  Physical Exam Updated Vital Signs BP 118/73   Pulse 88   Temp 98.7 F (37.1 C) (Oral)   Resp 16   Ht 5\' 6"  (1.676 m)   Wt 68.9 kg   SpO2 100%   BMI 24.52 kg/m  Physical Exam Vitals and nursing note reviewed.  Constitutional:      Appearance: He is well-developed.  HENT:     Head: Normocephalic and atraumatic.     Jaw: No trismus.     Right Ear: Tympanic membrane, ear canal and external ear normal.     Left Ear: Tympanic membrane, ear canal and external ear normal.     Nose: Nose  normal. No mucosal edema or rhinorrhea.     Mouth/Throat:     Mouth: Mucous membranes are not dry.     Pharynx: Uvula midline. Posterior oropharyngeal erythema present. No oropharyngeal exudate or uvula swelling.     Tonsils: No tonsillar abscesses.  Eyes:     General:        Right eye: No discharge.        Left eye: No discharge.     Conjunctiva/sclera: Conjunctivae normal.  Cardiovascular:     Rate and Rhythm: Normal rate and regular rhythm.     Heart sounds: Normal heart sounds.  Pulmonary:     Effort: Pulmonary effort is normal. No respiratory distress.     Breath sounds: Normal breath sounds. No wheezing or rales.  Abdominal:     Palpations: Abdomen is soft.     Tenderness: There is no abdominal tenderness.  Musculoskeletal:     Cervical back: Normal range of motion and neck supple.  Skin:    General: Skin is warm and dry.  Neurological:     Mental Status: He is alert.     ED Results / Procedures / Treatments   Labs (all labs ordered are listed, but only abnormal results are displayed) Labs Reviewed  GROUP A STREP BY PCR - Abnormal; Notable for the following components:      Result Value   Group A Strep by PCR DETECTED (*)    All other components within normal limits    EKG None  Radiology No results found.  Procedures Procedures  {Document cardiac monitor, telemetry assessment procedure when appropriate:1}  Medications Ordered in ED Medications  penicillin g benzathine (BICILLIN LA) 1200000 UNIT/2ML injection 1.2 Million Units (has no administration in time range)    ED Course/ Medical Decision Making/ A&P   {   Click here for ABCD2, HEART and other calculatorsREFRESH Note before signing :1}                          Medical Decision Making Risk Prescription drug management.   ***  {Document critical care time when appropriate:1} {Document review of labs and clinical decision tools ie heart score, Chads2Vasc2 etc:1}  {Document your independent  review of radiology images, and any outside records:1} {Document your discussion with family members, caretakers, and with consultants:1} {Document social determinants of health affecting pt's care:1} {Document your decision making why or why not admission, treatments were needed:1} Final Clinical Impression(s) / ED Diagnoses Final diagnoses:  Strep throat    Rx / DC Orders ED Discharge Orders     None

## 2022-05-19 NOTE — ED Triage Notes (Signed)
Pt c/o sore throat and dysphagiax3d. Pt denies any other sx.

## 2022-09-08 ENCOUNTER — Emergency Department (HOSPITAL_COMMUNITY): Payer: Self-pay

## 2022-09-08 ENCOUNTER — Encounter (HOSPITAL_COMMUNITY): Payer: Self-pay

## 2022-09-08 ENCOUNTER — Emergency Department (HOSPITAL_COMMUNITY)
Admission: EM | Admit: 2022-09-08 | Discharge: 2022-09-08 | Disposition: A | Discharge status: Home / Self Care | Payer: Self-pay | Attending: Emergency Medicine | Admitting: Emergency Medicine

## 2022-09-08 DIAGNOSIS — W208XXA Other cause of strike by thrown, projected or falling object, initial encounter: Secondary | ICD-10-CM | POA: Insufficient documentation

## 2022-09-08 DIAGNOSIS — M79672 Pain in left foot: Secondary | ICD-10-CM | POA: Insufficient documentation

## 2022-09-08 DIAGNOSIS — Y990 Civilian activity done for income or pay: Secondary | ICD-10-CM | POA: Insufficient documentation

## 2022-09-08 MED ORDER — IBUPROFEN 400 MG PO TABS
600.0000 mg | ORAL_TABLET | Freq: Once | ORAL | Status: AC
  Administered 2022-09-08: 600 mg via ORAL
  Filled 2022-09-08: qty 1

## 2022-09-08 NOTE — ED Provider Notes (Signed)
[place] [facility] DEPARTMENT AT [facility] Provider Note   CSN: [id number] Arrival date & time: 09/08/22  1642     History  Chief Complaint  Patient presents with   Foot Injury    Marco Atkinson is a 27 y.o. male presented after a pallet jack dropped on his left foot last night at work.  Patient is working night shift when a pallet jack fell on his left foot.  Since then patient states he has not been able to walk due to pain.  Patient states able to wiggle his toes and feels toes however does not move his ankle due to pain.  Patient has tried ibuprofen this morning but states that his foot still appears swollen and he still has pain.  Patient denied skin color changes, rigid compartments, change in sensation, knee pain, leg pain  Home Medications Prior to Admission medications   Medication Sig Start Date End Date Taking? Authorizing Provider  chlorhexidine (PERIDEX) 0.12 % solution Use as directed 15 mLs in the mouth or throat 2 (two) times daily. 06/16/20   Roxy Horseman, PA-C  dicyclomine (BENTYL) 20 MG tablet Take 1 tablet (20 mg total) by mouth 2 (two) times daily as needed (abdominal cramping). 04/28/20   Robinson, [place] N, PA-C  methocarbamol (ROBAXIN) 500 MG tablet Take 1 tablet (500 mg total) by mouth 2 (two) times daily. 01/24/22   Jeannie Fend, PA-C  fluticasone (FLONASE) 50 MCG/ACT nasal spray Place 1 spray into both nostrils daily. 12/12/18 04/09/19  Gailen Shelter, PA      Allergies    Patient has no known allergies.    Review of Systems   Review of Systems See HPI Physical Exam Updated Vital Signs BP (!) 141/94 (BP Location: Right Arm)   Pulse (!) 107   Temp 99.1 F (37.3 C) (Oral)   Resp 18   SpO2 98%  Physical Exam Constitutional:      General: He is not in acute distress. Cardiovascular:     Pulses: Normal pulses.     Comments: 2+ bilateral dorsalis pedal/posterior tibialis pulses with slightly increased rate Musculoskeletal:         General: Normal range of motion.     Comments: Able to wiggle toes on left side No step-off/crepitus/abnormalities palpated Soft compartments  Skin:    General: Skin is warm and dry.     Capillary Refill: Capillary refill takes less than 2 seconds.     Comments: No overlying skin color changes  Neurological:     Mental Status: He is alert.     Comments: Sensation intact distally     ED Results / Procedures / Treatments   Labs (all labs ordered are listed, but only abnormal results are displayed) Labs Reviewed - No data to display  EKG None  Radiology DG Ankle Complete Left  Result Date: 09/08/2022 CLINICAL DATA:  Left ankle pain after injury. EXAM: LEFT ANKLE COMPLETE - 3+ VIEW COMPARISON:  None Available. FINDINGS: There is no evidence of fracture, dislocation, or joint effusion. There is no evidence of arthropathy or other focal bone abnormality. Soft tissues are unremarkable. IMPRESSION: Negative. Electronically Signed   By: Lupita Raider M.D.   On: 09/08/2022 17:26   DG Foot Complete Left  Result Date: 09/08/2022 CLINICAL DATA:  Left foot pain after injury. EXAM: LEFT FOOT - COMPLETE 3+ VIEW COMPARISON:  None Available. FINDINGS: There is no evidence of fracture or dislocation. There is no evidence of arthropathy or other  focal bone abnormality. Soft tissues are unremarkable. IMPRESSION: Negative. Electronically Signed   By: Lupita Raider M.D.   On: 09/08/2022 17:25    Procedures Procedures    Medications Ordered in ED Medications  ibuprofen (ADVIL) tablet 600 mg (has no administration in time range)    ED Course/ Medical Decision Making/ A&P                             Medical Decision Making Amount and/or Complexity of Data Reviewed Radiology: ordered.   Marco Atkinson 27 y.o. presented today for left foot pain. Working DDx that I considered at this time includes, but not limited to, bruise, strain/sprain, fracture, dislocation, neurovascular compromise,  ischemic limb, compartment syndrome.  R/o DDx: strain/sprain, fracture, dislocation, neurovascular compromise, ischemic limb, compartment syndrome: These are considered less likely due to history of present illness and physical exam findings  Review of prior external notes: 01/24/2022 ED  Unique Tests and My Interpretation:  Left ankle x-ray: Unremarkable Left foot x-ray: Unremarkable  Discussion with Independent Historian: None  Discussion of Management of Tests: None  Risk: Medium: prescription drug management  Risk Stratification Score: none  Plan: On exam patient was in no acute distress stable vitals.  Patient was resting comfortably on his phone I arrived.  Patient was neurovascularly intact and did not have any red flag signs on his exam.  Patient did not want to bear weight stating that it would hurt too much and that he needs crutches.  Since patient is not had ibuprofen since this morning he will be given 1 dose of ibuprofen here before discharge.  X-rays are negative for any acute fractures or dislocations and so at this time I suspect patient bruised his foot and have low suspicion of any life-threatening diagnosis.  Patient be given crutches and encouraged follow-up with his primary care provider.  I encouraged patient to rest, ice, compress, elevate his left foot and if symptoms are to change or worsen to return to ER.  Patient was given return precautions. Patient stable for discharge at this time.  Patient verbalized understanding of plan.         Final Clinical Impression(s) / ED Diagnoses Final diagnoses:  Left foot pain    Rx / DC Orders ED Discharge Orders     None         Remi Deter 09/08/22 1923    Lonell Grandchild, MD 09/09/22 2223

## 2022-09-08 NOTE — ED Triage Notes (Signed)
Pt was using a pallet jack at work and it smashed his left foot between the wall when he was rolling it backwards, pt is unable to bear weight of the foot today and states it is really painful. There is some swelling noted in the foot.

## 2022-09-08 NOTE — Progress Notes (Signed)
Orthopedic Tech Progress Note Patient Details:  Marco Atkinson Feb 14, 1996 [id number]  Ortho Devices Type of Ortho Device: Crutches Ortho Device/Splint Interventions: Ordered, Application, Adjustment  The patient was able to get up and use the crutches without instruction. Post Interventions Patient Tolerated: Well Instructions Provided: Care of device, Adjustment of device  Trinna Post 09/08/2022, 8:30 PM

## 2022-09-08 NOTE — Discharge Instructions (Signed)
Please follow-up with your primary care provider or the one I have tissue for you in regards to recent symptoms and ER visit.  Today your x-ray does not show any fractures or abnormalities and you most likely bruised your foot.  You may use ibuprofen every 6 hours as needed for pain.  Please ice your foot and keep it elevated when lying down.  You were given crutches so that you do not need to bear weight on your foot.  If symptoms change or worsen please return to ER.

## 2022-12-11 ENCOUNTER — Other Ambulatory Visit: Payer: Self-pay

## 2022-12-11 ENCOUNTER — Emergency Department (HOSPITAL_BASED_OUTPATIENT_CLINIC_OR_DEPARTMENT_OTHER)
Admission: EM | Admit: 2022-12-11 | Discharge: 2022-12-11 | Disposition: A | Discharge status: Home / Self Care | Payer: 59 | Attending: Emergency Medicine | Admitting: Emergency Medicine

## 2022-12-11 DIAGNOSIS — J019 Acute sinusitis, unspecified: Secondary | ICD-10-CM

## 2022-12-11 DIAGNOSIS — Z1152 Encounter for screening for COVID-19: Secondary | ICD-10-CM | POA: Diagnosis not present

## 2022-12-11 DIAGNOSIS — J0100 Acute maxillary sinusitis, unspecified: Secondary | ICD-10-CM | POA: Diagnosis not present

## 2022-12-11 DIAGNOSIS — R519 Headache, unspecified: Secondary | ICD-10-CM | POA: Diagnosis not present

## 2022-12-11 LAB — RESP PANEL BY RT-PCR (RSV, FLU A&B, COVID)  RVPGX2
Influenza A by PCR: NEGATIVE
Influenza B by PCR: NEGATIVE
Resp Syncytial Virus by PCR: NEGATIVE
SARS Coronavirus 2 by RT PCR: NEGATIVE

## 2022-12-11 MED ORDER — AMOXICILLIN-POT CLAVULANATE 875-125 MG PO TABS: 1.0000 | ORAL_TABLET | Freq: Two times a day (BID) | ORAL | 0 refills | Status: AC

## 2022-12-11 NOTE — ED Triage Notes (Signed)
C/O sinus issue / runny nose for "a while"; and white irritation

## 2022-12-11 NOTE — ED Provider Notes (Signed)
[place] EMERGENCY DEPARTMENT AT [facility] Provider Note   CSN: [id number] Arrival date & time: 12/11/22  1108     History  Chief Complaint  Patient presents with   Facial Pain    Marco Atkinson is a 27 y.o. male with no significant past medical history who presents to the ED due to sinus pressure x 1.5 months.  Also admits to persistent rhinorrhea for over a month.  Admits to history of allergies when he was younger. Does not take allergy medication. No fever or chills.  Patient also admits to intermittent "white spots" on his tongue.  None present currently.  Does not have a PCP. Denies sore throat. No shortness of breath. Denies tongue swelling.   History obtained from patient and past medical records. No interpreter used during encounter.       Home Medications Prior to Admission medications   Medication Sig Start Date End Date Taking? Authorizing Provider  amoxicillin-clavulanate (AUGMENTIN) 875-125 MG tablet Take 1 tablet by mouth every 12 (twelve) hours. 12/11/22  Yes Brice Potteiger, Merla Riches, PA-C  chlorhexidine (PERIDEX) 0.12 % solution Use as directed 15 mLs in the mouth or throat 2 (two) times daily. 06/16/20   Roxy Horseman, PA-C  dicyclomine (BENTYL) 20 MG tablet Take 1 tablet (20 mg total) by mouth 2 (two) times daily as needed (abdominal cramping). 04/28/20   Robinson, [place] N, PA-C  methocarbamol (ROBAXIN) 500 MG tablet Take 1 tablet (500 mg total) by mouth 2 (two) times daily. 01/24/22   Jeannie Fend, PA-C  fluticasone (FLONASE) 50 MCG/ACT nasal spray Place 1 spray into both nostrils daily. 12/12/18 04/09/19  Gailen Shelter, PA      Allergies    Patient has no known allergies.    Review of Systems   Review of Systems  Constitutional:  Negative for chills and fever.  HENT:  Positive for rhinorrhea, sinus pressure and sinus pain. Negative for sore throat and trouble swallowing.     Physical Exam Updated Vital Signs BP (!) 148/91 (BP  Location: Right Arm)   Pulse 94   Temp 97.6 F (36.4 C) (Oral)   Resp 18   Ht 5\' 6"  (1.676 m)   Wt 73.9 kg   SpO2 100%   BMI 26.31 kg/m  Physical Exam Vitals and nursing note reviewed.  Constitutional:      General: He is not in acute distress.    Appearance: He is not ill-appearing.  HENT:     Head: Normocephalic.     Nose:     Comments: TTP throughout bilateral maxillary sinuses.     Mouth/Throat:     Comments: Normal tongue with no plaques Eyes:     Pupils: Pupils are equal, round, and reactive to light.  Cardiovascular:     Rate and Rhythm: Normal rate and regular rhythm.     Pulses: Normal pulses.     Heart sounds: Normal heart sounds. No murmur heard.    No friction rub. No gallop.  Pulmonary:     Effort: Pulmonary effort is normal.     Breath sounds: Normal breath sounds.  Abdominal:     General: Abdomen is flat. There is no distension.     Palpations: Abdomen is soft.     Tenderness: There is no abdominal tenderness. There is no guarding or rebound.  Musculoskeletal:        General: Normal range of motion.     Cervical back: Neck supple.  Skin:    General:  Skin is warm and dry.  Neurological:     General: No focal deficit present.     Mental Status: He is alert.  Psychiatric:        Mood and Affect: Mood normal.        Behavior: Behavior normal.     ED Results / Procedures / Treatments   Labs (all labs ordered are listed, but only abnormal results are displayed) Labs Reviewed  RESP PANEL BY RT-PCR (RSV, FLU A&B, COVID)  [id number]    EKG None  Radiology No results found.  Procedures Procedures    Medications Ordered in ED Medications - No data to display  ED Course/ Medical Decision Making/ A&P                                 Medical Decision Making Risk Prescription drug management.   27 year old male presents to the ED due to persistent sinus pressure x 1.5 months.  Also endorses rhinorrhea for over a month.  Previous history of  allergies, not currently on any allergy medication.  Also admits to "white spots" on tongue.  Upon arrival patient afebrile, not tachycardic or hypoxic.  Patient in no acute distress.  Slight tenderness to bilateral maxillary sinuses.  Slight rhinorrhea on exam.  Normal tongue without any white spots.  Given sinus pressure has been present for over a month will discharge with Augmentin for sinusitis.  RVP negative. Advised patient to start taking an over-the-counter allergy medication.  Unclear etiology of tongue symptoms given he is currently asymptomatic at this time.  Advised patient follow-up with PCP.  Patient stable for discharge. Strict ED precautions discussed with patient. Patient states understanding and agrees to plan. Patient discharged home in no acute distress and stable vitals  No PCP       Final Clinical Impression(s) / ED Diagnoses Final diagnoses:  Acute non-recurrent sinusitis, unspecified location    Rx / DC Orders ED Discharge Orders          Ordered    amoxicillin-clavulanate (AUGMENTIN) 875-125 MG tablet  Every 12 hours        12/11/22 1131              Marco Atkinson 12/11/22 1230    Alvira Monday, MD 12/11/22 2319

## 2022-12-11 NOTE — Discharge Instructions (Addendum)
It was a pleasure taking care of you today as discussed, I am sending you home with antibiotics for possible sinus infection.  Take as prescribed and finish all antibiotics.  I recommend taking an over-the-counter allergy medication.  Please follow-up with PCP for further recommendations for your tongue.  I do not see anything on exam today.  Return to the ER for any worsening symptoms.

## 2022-12-21 ENCOUNTER — Emergency Department (HOSPITAL_COMMUNITY): Payer: 59

## 2022-12-21 ENCOUNTER — Emergency Department (HOSPITAL_COMMUNITY)
Admission: EM | Admit: 2022-12-21 | Discharge: 2022-12-21 | Discharge status: Against Medical Advice | Payer: 59 | Attending: Emergency Medicine | Admitting: Emergency Medicine

## 2022-12-21 ENCOUNTER — Other Ambulatory Visit: Payer: Self-pay

## 2022-12-21 DIAGNOSIS — Z5321 Procedure and treatment not carried out due to patient leaving prior to being seen by health care provider: Secondary | ICD-10-CM | POA: Diagnosis not present

## 2022-12-21 DIAGNOSIS — Y92410 Unspecified street and highway as the place of occurrence of the external cause: Secondary | ICD-10-CM | POA: Insufficient documentation

## 2022-12-21 DIAGNOSIS — M25512 Pain in left shoulder: Secondary | ICD-10-CM | POA: Insufficient documentation

## 2022-12-21 DIAGNOSIS — R079 Chest pain, unspecified: Secondary | ICD-10-CM | POA: Diagnosis not present

## 2022-12-21 DIAGNOSIS — R0781 Pleurodynia: Secondary | ICD-10-CM | POA: Diagnosis not present

## 2022-12-21 DIAGNOSIS — Z041 Encounter for examination and observation following transport accident: Secondary | ICD-10-CM | POA: Diagnosis not present
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
